# Patient Record
Sex: Male | Born: 1990 | Race: Black or African American | Hispanic: No | Marital: Single | Smoking: Former smoker
Health system: Southern US, Community
[De-identification: ages and names within clinical notes are randomized; demographics above are authoritative.]

## PROBLEM LIST (undated history)

## (undated) DIAGNOSIS — F419 Anxiety disorder, unspecified: Secondary | ICD-10-CM

## (undated) DIAGNOSIS — J302 Other seasonal allergic rhinitis: Secondary | ICD-10-CM

## (undated) DIAGNOSIS — J45909 Unspecified asthma, uncomplicated: Secondary | ICD-10-CM

## (undated) HISTORY — DX: Other seasonal allergic rhinitis: J30.2

## (undated) HISTORY — PX: NO PAST SURGERIES: SHX2092

---

## 2012-01-21 ENCOUNTER — Emergency Department (HOSPITAL_COMMUNITY)
Admission: EM | Admit: 2012-01-21 | Discharge: 2012-01-22 | Disposition: A | Discharge status: Other Acute Inpatient Hospital | Payer: BC Managed Care – PPO | Attending: Emergency Medicine | Admitting: Emergency Medicine

## 2012-01-21 ENCOUNTER — Encounter (HOSPITAL_COMMUNITY): Payer: Self-pay | Admitting: Emergency Medicine

## 2012-01-21 DIAGNOSIS — T394X2A Poisoning by antirheumatics, not elsewhere classified, intentional self-harm, initial encounter: Secondary | ICD-10-CM | POA: Insufficient documentation

## 2012-01-21 DIAGNOSIS — R45851 Suicidal ideations: Secondary | ICD-10-CM | POA: Insufficient documentation

## 2012-01-21 DIAGNOSIS — T391X1A Poisoning by 4-Aminophenol derivatives, accidental (unintentional), initial encounter: Secondary | ICD-10-CM | POA: Insufficient documentation

## 2012-01-21 DIAGNOSIS — T1491XA Suicide attempt, initial encounter: Secondary | ICD-10-CM

## 2012-01-21 DIAGNOSIS — F17200 Nicotine dependence, unspecified, uncomplicated: Secondary | ICD-10-CM | POA: Insufficient documentation

## 2012-01-21 LAB — COMPREHENSIVE METABOLIC PANEL
Albumin: 4.3 g/dL (ref 3.5–5.2)
BUN: 12 mg/dL (ref 6–23)
CO2: 21 mEq/L (ref 19–32)
Chloride: 103 mEq/L (ref 96–112)
Creatinine, Ser: 0.86 mg/dL (ref 0.50–1.35)
GFR calc Af Amer: >90 mL/min (ref >90)
GFR calc non Af Amer: >90 mL/min (ref >90)
Glucose, Bld: 136 mg/dL — ABNORMAL HIGH (ref 70–99)
Total Bilirubin: 0.3 mg/dL (ref 0.3–1.2)

## 2012-01-21 LAB — RAPID URINE DRUG SCREEN, HOSP PERFORMED
Amphetamines: NOT DETECTED
Benzodiazepines: NOT DETECTED
Opiates: POSITIVE — AB

## 2012-01-21 LAB — CBC WITH DIFFERENTIAL/PLATELET
Basophils Absolute: 0 10*3/uL (ref 0.0–0.1)
Eosinophils Relative: 2 % (ref 0–5)
HCT: 40.2 % (ref 39.0–52.0)
Lymphocytes Relative: 37 % (ref 12–46)
MCV: 81.5 fL (ref 78.0–100.0)
Monocytes Absolute: 0.6 10*3/uL (ref 0.1–1.0)
RDW: 13.4 % (ref 11.5–15.5)
WBC: 8.8 10*3/uL (ref 4.0–10.5)

## 2012-01-21 LAB — URINALYSIS, ROUTINE W REFLEX MICROSCOPIC
Glucose, UA: NEGATIVE mg/dL
Leukocytes, UA: NEGATIVE
Protein, ur: NEGATIVE mg/dL
Specific Gravity, Urine: 1.022 (ref 1.005–1.030)
pH: 5.5 (ref 5.0–8.0)

## 2012-01-21 LAB — ETHANOL: Alcohol, Ethyl (B): <11 mg/dL (ref 0–11)

## 2012-01-21 MED ORDER — LORAZEPAM 1 MG PO TABS: 1.0000 mg | ORAL_TABLET | Freq: Three times a day (TID) | ORAL | Status: DC | PRN

## 2012-01-21 MED ORDER — ALUM & MAG HYDROXIDE-SIMETH 200-200-20 MG/5ML PO SUSP: 30.0000 mL | ORAL | Status: DC | PRN

## 2012-01-21 MED ORDER — NICOTINE 21 MG/24HR TD PT24: 21.0000 mg | MEDICATED_PATCH | Freq: Every day | TRANSDERMAL | Status: DC

## 2012-01-21 MED ORDER — IBUPROFEN 600 MG PO TABS
600.0000 mg | ORAL_TABLET | Freq: Three times a day (TID) | ORAL | Status: DC | PRN
  Administered 2012-01-21: 600 mg via ORAL
  Filled 2012-01-21: qty 3

## 2012-01-21 MED ORDER — DIPHENHYDRAMINE HCL 50 MG/ML IJ SOLN
25.0000 mg | Freq: Once | INTRAMUSCULAR | Status: AC
  Administered 2012-01-21: 25 mg via INTRAVENOUS
  Filled 2012-01-21: qty 1

## 2012-01-21 MED ORDER — ONDANSETRON HCL 4 MG PO TABS: 4.0000 mg | ORAL_TABLET | Freq: Three times a day (TID) | ORAL | Status: DC | PRN

## 2012-01-21 NOTE — ED Notes (Signed)
Pt gave this Clinical research associate permission to update his mom.  Mother, Jani Gravel, on her way.

## 2012-01-21 NOTE — ED Notes (Signed)
OZH:[id number]<HQ> Expected date:01/21/12<BR> Expected time: 3:36 AM<BR> Means of arrival:Ambulance<BR> Comments:<BR> RM 17: OD hydrocodone
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
## 2012-01-21 NOTE — ED Notes (Signed)
Pt sleeping. 

## 2012-01-21 NOTE — ED Notes (Signed)
Pt belongs are at nurses station .  Id, Keys and clothing are in bag.

## 2012-01-21 NOTE — Progress Notes (Signed)
Patient accepted by Jorje Guild, PA to [facility] pending an available bed. Currently no beds available. Rosey Bath, RN

## 2012-01-21 NOTE — ED Provider Notes (Signed)
Medical screening examination/treatment/procedure(s) were performed by non-physician practitioner and as supervising physician I was immediately available for consultation/collaboration.   Sunnie Nielsen, MD 01/21/12 [phone]
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
## 2012-01-21 NOTE — ED Notes (Signed)
Pt belongings in locker number 39 one belonging bag.
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
## 2012-01-21 NOTE — ED Provider Notes (Signed)
Assumed care of patient at change of shift.  Sign out received from Marlon Pel, PA-C.  Pt with suicide attempt overnight, overdose on Tylenol #3.  Plan is for 4 hour tylenol level (already ordered).  If medically cleared, pt to be seen by ACT for placement.  Pt is currently sleeping.  No concerns or needs at this time.  Pt is sleeping but wakes up easily, NAD, RRR, CTAB, abd soft, NT.   9:24 AM Discussed with Tammy Sours, Child psychotherapist, who is covering ACT this morning.  4 hour tylenol was negative.    Tammy Sours has seen patient and is sending in paperwork for placement for suicide attempt.     Results for orders placed during the hospital encounter of 01/21/12  URINALYSIS, ROUTINE W REFLEX MICROSCOPIC      Component Value Range   Color, Urine YELLOW  YELLOW   APPearance CLEAR  CLEAR   Specific Gravity, Urine 1.022  1.005 - 1.030   pH 5.5  5.0 - 8.0   Glucose, UA NEGATIVE  NEGATIVE mg/dL   Hgb urine dipstick NEGATIVE  NEGATIVE   Bilirubin Urine NEGATIVE  NEGATIVE   Ketones, ur NEGATIVE  NEGATIVE mg/dL   Protein, ur NEGATIVE  NEGATIVE mg/dL   Urobilinogen, UA 0.2  0.0 - 1.0 mg/dL   Nitrite NEGATIVE  NEGATIVE   Leukocytes, UA NEGATIVE  NEGATIVE  URINE RAPID DRUG SCREEN (HOSP PERFORMED)      Component Value Range   Opiates POSITIVE (*) NONE DETECTED   Cocaine NONE DETECTED  NONE DETECTED   Benzodiazepines NONE DETECTED  NONE DETECTED   Amphetamines NONE DETECTED  NONE DETECTED   Tetrahydrocannabinol NONE DETECTED  NONE DETECTED   Barbiturates NONE DETECTED  NONE DETECTED  SALICYLATE LEVEL      Component Value Range   Salicylate Lvl <2.0 (*) 2.8 - 20.0 mg/dL  CBC WITH DIFFERENTIAL      Component Value Range   WBC 8.8  4.0 - 10.5 K/uL   RBC 4.93  4.22 - 5.81 MIL/uL   Hemoglobin 14.6  13.0 - 17.0 g/dL   HCT 09.8  11.9 - 14.7 %   MCV 81.5  78.0 - 100.0 fL   MCH 29.6  26.0 - 34.0 pg   MCHC 36.3 (*) 30.0 - 36.0 g/dL   RDW 82.9  56.2 - 13.0 %   Platelets 276  150 - 400 K/uL   Neutrophils  Relative 54  43 - 77 %   Neutro Abs 4.8  1.7 - 7.7 K/uL   Lymphocytes Relative 37  12 - 46 %   Lymphs Abs 3.3  0.7 - 4.0 K/uL   Monocytes Relative 6  3 - 12 %   Monocytes Absolute 0.6  0.1 - 1.0 K/uL   Eosinophils Relative 2  0 - 5 %   Eosinophils Absolute 0.2  0.0 - 0.7 K/uL   Basophils Relative 0  0 - 1 %   Basophils Absolute 0.0  0.0 - 0.1 K/uL  ACETAMINOPHEN LEVEL      Component Value Range   Acetaminophen (Tylenol), Serum <15.0  10 - 30 ug/mL  COMPREHENSIVE METABOLIC PANEL      Component Value Range   Sodium 138  135 - 145 mEq/L   Potassium 3.3 (*) 3.5 - 5.1 mEq/L   Chloride 103  96 - 112 mEq/L   CO2 21  19 - 32 mEq/L   Glucose, Bld 136 (*) 70 - 99 mg/dL   BUN 12  6 - 23 mg/dL  Creatinine, Ser 0.86  0.50 - 1.35 mg/dL   Calcium 9.5  8.4 - 16.1 mg/dL   Total Protein 7.5  6.0 - 8.3 g/dL   Albumin 4.3  3.5 - 5.2 g/dL   AST 27  0 - 37 U/L   ALT 36  0 - 53 U/L   Alkaline Phosphatase 77  39 - 117 U/L   Total Bilirubin 0.3  0.3 - 1.2 mg/dL   GFR calc non Af Amer >90  >90 mL/min   GFR calc Af Amer >90  >90 mL/min  ETHANOL      Component Value Range   Alcohol, Ethyl (B) <11  0 - 11 mg/dL   No results found.    [employer], [place] 01/21/12 1630

## 2012-01-22 ENCOUNTER — Inpatient Hospital Stay (HOSPITAL_COMMUNITY)
Admission: AD | Admit: 2012-01-22 | Discharge: 2012-01-24 | DRG: 425 | Disposition: A | Discharge status: Home / Self Care | Payer: BC Managed Care – PPO | Source: Ambulatory Visit | Attending: Psychiatry | Admitting: Psychiatry

## 2012-01-22 ENCOUNTER — Encounter (HOSPITAL_COMMUNITY): Payer: Self-pay | Admitting: *Deleted

## 2012-01-22 DIAGNOSIS — F605 Obsessive-compulsive personality disorder: Secondary | ICD-10-CM

## 2012-01-22 DIAGNOSIS — F411 Generalized anxiety disorder: Principal | ICD-10-CM | POA: Diagnosis present

## 2012-01-22 DIAGNOSIS — F419 Anxiety disorder, unspecified: Secondary | ICD-10-CM | POA: Diagnosis present

## 2012-01-22 DIAGNOSIS — F429 Obsessive-compulsive disorder, unspecified: Secondary | ICD-10-CM | POA: Diagnosis present

## 2012-01-22 MED ORDER — IBUPROFEN 800 MG PO TABS
800.0000 mg | ORAL_TABLET | Freq: Three times a day (TID) | ORAL | Status: DC
  Filled 2012-01-22: qty 1

## 2012-01-22 MED ORDER — ALUM & MAG HYDROXIDE-SIMETH 200-200-20 MG/5ML PO SUSP: 30.0000 mL | ORAL | Status: DC | PRN

## 2012-01-22 MED ORDER — ACETAMINOPHEN 325 MG PO TABS: 650.0000 mg | ORAL_TABLET | Freq: Four times a day (QID) | ORAL | Status: DC | PRN

## 2012-01-22 MED ORDER — MAGNESIUM HYDROXIDE 400 MG/5ML PO SUSP: 30.0000 mL | Freq: Every day | ORAL | Status: DC | PRN

## 2012-01-22 MED ORDER — IBUPROFEN 600 MG PO TABS
600.0000 mg | ORAL_TABLET | Freq: Four times a day (QID) | ORAL | Status: DC | PRN
  Administered 2012-01-23: 600 mg via ORAL
  Filled 2012-01-22: qty 1

## 2012-01-22 MED ORDER — POTASSIUM CHLORIDE 20 MEQ PO PACK
20.0000 meq | PACK | Freq: Once | ORAL | Status: DC
  Filled 2012-01-22: qty 1

## 2012-01-22 MED ORDER — HYDROXYZINE HCL 50 MG PO TABS
50.0000 mg | ORAL_TABLET | Freq: Every evening | ORAL | Status: DC | PRN
  Administered 2012-01-22 – 2012-01-23 (×2): 50 mg via ORAL
  Filled 2012-01-22: qty 4
  Filled 2012-01-22 (×3): qty 1
  Filled 2012-01-22: qty 4
  Filled 2012-01-22 (×3): qty 1

## 2012-01-22 MED ORDER — NICOTINE 21 MG/24HR TD PT24
21.0000 mg | MEDICATED_PATCH | Freq: Every day | TRANSDERMAL | Status: DC
  Filled 2012-01-22: qty 1

## 2012-01-22 MED ORDER — POTASSIUM CHLORIDE CRYS ER 20 MEQ PO TBCR
20.0000 meq | EXTENDED_RELEASE_TABLET | Freq: Once | ORAL | Status: AC
  Administered 2012-01-22: 20 meq via ORAL
  Filled 2012-01-22 (×2): qty 1

## 2012-01-22 MED ORDER — HYDROXYZINE HCL 50 MG PO TABS
50.0000 mg | ORAL_TABLET | Freq: Every evening | ORAL | Status: DC | PRN
  Filled 2012-01-22: qty 1

## 2012-01-22 NOTE — ED Notes (Addendum)
Report called to [employer] on adult unit at [facility].

## 2012-01-22 NOTE — ED Notes (Signed)
[employer] delivered envelope to pt. Pt resting in the bed

## 2012-01-22 NOTE — ED Provider Notes (Signed)
Assuming care of the patient this morning. Patient in the ED for tylenol OD. Accepted by [facility], awaiting bed. Patient had no complains, no concerns from the nursing side. Will continue to monitor.  Derwood Kaplan, MD 01/22/12 1315
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
## 2012-01-22 NOTE — ED Notes (Addendum)
Pt to be transported to [facility] as soon as they open intake again. Admissions temporarily on hold due to a CIRT, per previous shift staff.
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
## 2012-01-23 DIAGNOSIS — F605 Obsessive-compulsive personality disorder: Secondary | ICD-10-CM | POA: Diagnosis present

## 2012-01-23 DIAGNOSIS — F4321 Adjustment disorder with depressed mood: Secondary | ICD-10-CM

## 2012-01-23 DIAGNOSIS — F419 Anxiety disorder, unspecified: Secondary | ICD-10-CM | POA: Diagnosis present

## 2012-01-23 LAB — COMPREHENSIVE METABOLIC PANEL
Albumin: 4.2 g/dL (ref 3.5–5.2)
Alkaline Phosphatase: 75 U/L (ref 39–117)
BUN: 14 mg/dL (ref 6–23)
Calcium: 9.6 mg/dL (ref 8.4–10.5)
GFR calc Af Amer: >90 mL/min (ref >90)
Glucose, Bld: 101 mg/dL — ABNORMAL HIGH (ref 70–99)
Potassium: 3.7 mEq/L (ref 3.5–5.1)
Total Protein: 7.6 g/dL (ref 6.0–8.3)
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
## 2012-01-23 NOTE — Progress Notes (Signed)
[facility] Group Notes:  (Counselor/Nursing/MHT/Case Management/Adjunct)  01/23/2012 7:22 PM  Type of Therapy:  Psychoeducational Skills  Participation Level:  Active  Participation Quality:  Appropriate  Affect:  Appropriate  Cognitive:  Appropriate  Insight:  Good  Engagement in Group:  Good  Engagement in Therapy:  Good  Modes of Intervention:  Education  Summary of Progress/Problems:Patient was instructed to identify two areas in their life in which they would like to make a change toward recovery. Patient was encouraged to define "recovery" and what it means to their present situation. Patient was encouraged to set short realistic goals as well as long term goals.   Ardelle Park O 01/23/2012, 7:22 PM

## 2012-01-23 NOTE — Progress Notes (Signed)
Psychoeducational Group Note  Date:  01/23/2012 Time:  2000  Group Topic/Focus:  Wrap-Up Group:   The focus of this group is to help patients review their daily goal of treatment and discuss progress on daily workbooks.  Participation Level:  Active  Participation Quality:  Patient did attend AA meeting tonight.  Affect:    Cognitive:    Insight:    Engagement in Group:    Additional Comments: Patient did attend AA meeting tonight.  Cameo Schmiesing N 01/23/2012, 10:13 PM 

## 2012-01-23 NOTE — Tx Team (Signed)
Patient seen during d/c planning group and treatment team.  He reports attempting suicide due to fear roommates were going to kill him and problems with girlfriend.  He advised roommates are drug users and dealers and this has caused problems with their relationship.Patient currently denies SI/HI.  He reports having home, transportation and access to medications.  Patient signed consent for contact with CM at [facility].  Brayton Caves advised of plans to meet with patient on 01/24/12.  Horace Porteous Mckala Pantaleon, LCSW

## 2012-01-23 NOTE — H&P (Signed)
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 I have seen and examined this patient and agree with the major elements of this evaluation.

## 2012-01-23 NOTE — Tx Team (Signed)
Interdisciplinary Treatment Plan Update (Adult)  Date:  01/23/2012  Time Reviewed:  9:46 AM   Progress in Treatment: Attending groups:   Yes   Participating in groups:  Yes Taking medication as prescribed:  Yes Tolerating medication:  Yes Family/Significant othe contact made: Counselor to follow up patient on collateral contact Patient understands diagnosis:  Yes Discussing patient identified problems/goals with staff: Yes Medical problems stabilized or resolved: Yes Denies suicidal/homicidal ideation:Yes Issues/concerns per patient self-inventory:  Other:  New problem(s) identified:  Reason for Continuation of Hospitalization: Anxiety Depression Medication stabilization  Interventions implemented related to continuation of hospitalization:  Medication Management; safety checks q 15 mins  Additional comments:  Estimated length of stay:  Discharge Plan:  New goal(s):  Review of initial/current patient goals per problem list:    1.  Goal(s): Eliminate SI/other thoughts of self harm ]  Met:  Yes  Target date:  As evidenced by: Patient no longer endorses SI/other thought self harm    2.  Goal (s):  Reduce depression/anxiety   Met:  No  Target date: d/c  As evidenced by: Patient currently rating symptoms at four or below    3.  Goal(s): refer for outpatient services  Met:  No  Target date:  As evidenced by: Follow up appointment will be scheduled    4.  Goal(s): .stabilize on meds   Met:  No  Target date: d/c  As evidenced by: Patient reports being stabilized on medications - less symptomatic    Attendees: Patient:  Roy Ferguson 01/23/2012 9:56 AM  Nursing:  Lennette Bihari, RN   01/23/2012 9:57 AM  Physician:  Orson Aloe, MD 01/23/2012 9:46 AM   Nursing:   Quintella Reichert, RN 01/23/2012 9:46 AM   CaseManager:  Juline Patch, LCSW 01/23/2012 9:46 AM   Counselor:  Angus Palms, LCSW 01/23/2012 9:46 AM   Other:  Reyes Ivan,  LCSWA     01/23/2012 9:59 AM
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
## 2012-01-23 NOTE — Progress Notes (Signed)
Pt observed in the dayroom watching TV.  Minimal interaction with peers.  Pt attended AA group on the the 500 hall this evening.  Pt reports his day has been good.  He has attended groups and met with the MD.  Pt voiced no needs/concerns at this time with this Clinical research associate.  Pt denies SI/HI/AV.  Encouraged pt to make his needs known to staff.  Pt voiced understanding.  Pt given support/encouragement.  Safety maintained with q15 minute checks.

## 2012-01-23 NOTE — Progress Notes (Addendum)
D:  Patient self inventory sheet, patient sleeps fair, has good appetite, normal energy level, good attention span.  Denied withdrawals.   Denied SI.   Has had headache in past 24 hours.  Worst pain #1.  After discharge, desires to handle stress better.  No problems taking meds after discharge. A:  Meds per MD order.  Support and encouragement given throughout day.  Safety checks per MD order. R:  Following treatment plan.   Denies SI and HI.   Denied A/V hallucinations.  Denied pain.  Patient remains safe and receptive on unit.  Patient has attended groups today.  Has been cooperative and pleasant.
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
## 2012-01-24 MED ORDER — HYDROXYZINE HCL 50 MG PO TABS: 50.0000 mg | ORAL_TABLET | Freq: Every evening | ORAL | Status: AC | PRN
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
## 2012-01-24 NOTE — Tx Team (Signed)
Interdisciplinary Treatment Plan Update (Adult)  Date:  01/24/2012  Time Reviewed:  10:15 AM   Progress in Treatment: Attending groups:   Yes   Participating in groups:  Yes Taking medication as prescribed:  Yes Tolerating medication:  Yes Family/Significant othe contact made: Counselor to make contact with family prior to his discharge Patient understands diagnosis:  Yes Discussing patient identified problems/goals with staff: Yes Medical problems stabilized or resolved: Yes Denies suicidal/homicidal ideation:Yes Issues/concerns per patient self-inventory:  Other:  New problem(s) identified:  Reason for Continuation of Hospitalization:  Interventions implemented related to continuation of hospitalization:  Additional comments:  Estimated length of stay:  Discharge today  Discharge Plan: Home with outpatient follow up  New goal(s):  Review of initial/current patient goals per problem list:    1.  Goal(s):  Eliminate SI/other thoughts of self harm   Met:  Yes  Target date:  d/c  As evidenced by:  Patient no longer endorses SI/other thought self harm    2.  Goal (s): Reduce depression/anxiety   Met:  Yes  Target date: d/c  As evidenced by: Patient currently rating symptoms at four or below    3.  Goal(s): .stabilize on meds   Met:  Yes  Target date: d/c  As evidenced by: Patient reports being stabilized on medications - less symptomatic  4.  Goal(s): Refer for outpatient follow up   Met:  Yes  Target date: d/c  As evidenced by:  Follow up scheduled with [facility]  Attendees: Patient:  Roy Ferguson 01/24/2012 10:15 AM  Nursing:  Nestor Ramp, RN 01/24/2012 10:16 AM  Physician:  Orson Aloe, MD 01/24/2012 10:15 AM   Nursing:   Marlaine Hind 01/24/2012 10:15 AM   CaseManager:  Juline Patch, LCSW 01/24/2012 10:15 AM   Counselor:  Angus Palms, LCSW 01/24/2012 10:15 AM   Other:  Rana Snare Bournes      01/24/2012 10:16 AM
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
## 2012-01-24 NOTE — Progress Notes (Signed)
Psychoeducational Group Note  Date:  01/24/2012 Time: 1100  Group Topic/Focus:  Personal Choices and Values:   The focus of this group is to help patients assess and explore the importance of values in their lives, how their values affect their decisions, how they express their values and what opposes their expression.  Participation Level:  Active  Participation Quality:  Appropriate, Attentive, Sharing and Supportive  Affect:  Appropriate  Cognitive:  Appropriate  Insight:  Good  Engagement in Group:  Good  Additional Comments:  Pt participated in group of personal values and choices. Pt was asked what were important values that were positive. Pt also asked what were negative values and choices that have been made in the past and how it affected future. Pt complete worksheet that was given out on identifying values, and values-oriented life worksheet.  Pt shared and was appropriate during group.   Karleen Hampshire Brittini 01/24/2012, 1:38 PM

## 2012-01-24 NOTE — Progress Notes (Signed)
Group Note  Date:  01/24/2012 Time:  1:15  Group Topic/Focus:  Emotional Education:   The focus of this group is to discuss what feelings/emotions are, and how they are experienced.  Participation Level:  Active  Participation Quality:  Appropriate  Affect:  Appropriate  Cognitive:  Appropriate  Insight:  Good  Engagement in Group:  Good  Additional Comments:  Roy Ferguson was engaged throughout group and appropriately shared his thoughts regularly. He left around 2/3rds into group.  Filip Luten S 01/24/2012, 3:27 PM
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
## 2012-01-24 NOTE — Progress Notes (Signed)
Patient denies SI/HI, denies A/V hallucinations. Patient verbalizes understanding of discharge instructions, follow up care and prescriptions. Patient given all belongings from [facility] locker. Patient escorted out by staff, transported by public transportation.  

## 2012-01-24 NOTE — Progress Notes (Signed)
[facility] Family/Significant Other Suicide Prevention Education  Suicide Prevention Education:  Education Completed;Troy Towry, father, [phone]) has been identified by the patient as the family member/significant other with whom the patient will be residing, and identified as the person(s) who will aid the patient in the event of a mental health crisis (suicidal ideations/suicide attempt).  With written consent from the patient, the family member/significant other has been provided the following suicide prevention education, prior to the and/or following the discharge of the patient.  The suicide prevention education provided includes the following:  Suicide risk factors  Suicide prevention and interventions  National Suicide Hotline telephone number  [facility] assessment telephone number  [facility] Emergency Assistance 911  [facility] and/or Residential Mobile Crisis Unit telephone number  Request made of family/significant other to:  Remove weapons (e.g., guns, rifles, knives), all items previously/currently identified as safety concern.    Remove drugs/medications (over-the-counter, prescriptions, illicit drugs), all items previously/currently identified as a safety concern.  Kendell Bane reported that he has never known Mykah to be violent or suicidal before, although parents have wondered about his anger and wanted to get him help as a teenager, which Danile refused. He stated that he is working on Sales executive apartment changed, as he does not want him returning to live with the roommates who have made a verbal threat toward him.   Kendell Bane verbalized understanding of suicide prevention information and does not think that Ural has access to firearms. He is in support of discharge plan, including appointments with Dr. Piedad Climes and Jerolyn Shin at [facility].    Billie Lade 01/24/2012, 2:45 PM

## 2012-01-26 NOTE — Progress Notes (Signed)
Patient Discharge Instructions:  After Visit Summary (AVS):   Faxed to:  01/26/2012 Psychiatric Admission Assessment Note:   Faxed to:  01/26/2012 Suicide Risk Assessment - Discharge Assessment:   Faxed to:  01/26/2012 Faxed/Sent to the Next Level Care provider:  01/26/2012  Faxed to [facility] - Casimer Bilis, Dr. Piedad Climes @ [phone]  Heloise Purpura, Eduard Clos, 01/26/2012, 1:57 PM

## 2012-03-10 ENCOUNTER — Emergency Department (HOSPITAL_COMMUNITY)
Admission: EM | Admit: 2012-03-10 | Discharge: 2012-03-10 | Disposition: A | Discharge status: Home / Self Care | Payer: BC Managed Care – PPO | Attending: Emergency Medicine | Admitting: Emergency Medicine

## 2012-03-10 ENCOUNTER — Encounter (HOSPITAL_COMMUNITY): Payer: Self-pay | Admitting: *Deleted

## 2012-03-10 DIAGNOSIS — R45851 Suicidal ideations: Secondary | ICD-10-CM | POA: Insufficient documentation

## 2012-03-10 DIAGNOSIS — F329 Major depressive disorder, single episode, unspecified: Secondary | ICD-10-CM | POA: Insufficient documentation

## 2012-03-10 DIAGNOSIS — F17200 Nicotine dependence, unspecified, uncomplicated: Secondary | ICD-10-CM | POA: Insufficient documentation

## 2012-03-10 DIAGNOSIS — F10929 Alcohol use, unspecified with intoxication, unspecified: Secondary | ICD-10-CM

## 2012-03-10 DIAGNOSIS — F1010 Alcohol abuse, uncomplicated: Secondary | ICD-10-CM | POA: Insufficient documentation

## 2012-03-10 DIAGNOSIS — F3289 Other specified depressive episodes: Secondary | ICD-10-CM | POA: Insufficient documentation

## 2012-03-10 LAB — RAPID URINE DRUG SCREEN, HOSP PERFORMED
Amphetamines: NOT DETECTED
Benzodiazepines: NOT DETECTED
Cocaine: NOT DETECTED
Opiates: NOT DETECTED
Tetrahydrocannabinol: NOT DETECTED

## 2012-03-10 LAB — CBC
MCH: 29.7 pg (ref 26.0–34.0)
MCHC: 36.5 g/dL — ABNORMAL HIGH (ref 30.0–36.0)
MCV: 81.4 fL (ref 78.0–100.0)
Platelets: 294 10*3/uL (ref 150–400)
RDW: 13.2 % (ref 11.5–15.5)

## 2012-03-10 LAB — COMPREHENSIVE METABOLIC PANEL
ALT: 25 U/L (ref 0–53)
AST: 25 U/L (ref 0–37)
Albumin: 4.3 g/dL (ref 3.5–5.2)
CO2: 23 mEq/L (ref 19–32)
Calcium: 9.2 mg/dL (ref 8.4–10.5)
Creatinine, Ser: 0.93 mg/dL (ref 0.50–1.35)
GFR calc non Af Amer: >90 mL/min (ref >90)
Sodium: 138 mEq/L (ref 135–145)
Total Protein: 7.4 g/dL (ref 6.0–8.3)

## 2012-03-10 LAB — ACETAMINOPHEN LEVEL: Acetaminophen (Tylenol), Serum: <15 ug/mL (ref 10–30)

## 2012-03-10 LAB — SALICYLATE LEVEL: Salicylate Lvl: <2 mg/dL — ABNORMAL LOW (ref 2.8–20.0)

## 2012-03-10 MED ORDER — NICOTINE 21 MG/24HR TD PT24: 21.0000 mg | MEDICATED_PATCH | Freq: Every day | TRANSDERMAL | Status: DC

## 2012-03-10 MED ORDER — ZOLPIDEM TARTRATE 5 MG PO TABS: 5.0000 mg | ORAL_TABLET | Freq: Every evening | ORAL | Status: DC | PRN

## 2012-03-10 MED ORDER — IBUPROFEN 600 MG PO TABS: 600.0000 mg | ORAL_TABLET | Freq: Three times a day (TID) | ORAL | Status: DC | PRN

## 2012-03-10 MED ORDER — ONDANSETRON HCL 4 MG PO TABS: 4.0000 mg | ORAL_TABLET | Freq: Three times a day (TID) | ORAL | Status: DC | PRN

## 2012-03-10 MED ORDER — ACETAMINOPHEN 325 MG PO TABS: 650.0000 mg | ORAL_TABLET | ORAL | Status: DC | PRN

## 2012-03-10 MED ORDER — ALUM & MAG HYDROXIDE-SIMETH 200-200-20 MG/5ML PO SUSP
30.0000 mL | ORAL | Status: DC | PRN
Start: 2012-03-10 — End: 2012-03-10

## 2012-03-10 NOTE — ED Notes (Signed)
Pt was up to bathroom 

## 2012-03-10 NOTE — ED Notes (Signed)
Pt signed No Harm Contract and verbalized his understanding of not to harm himself upon discharge. CSW provided pt with information for Mobile Crisis in the event that he has suicidal ideations in the future and encouraged pt to contact Mobile Crisis or return to [facility] for evaluation. Pt verbalized to CSW that he is not currently suicidal and that he anticipates following up with his counselor at [facility]. No other concerns verbalized by pt at this time.  Janann Colonel., MSW, [facility] Clinical Social Worker [phone]

## 2012-03-10 NOTE — BH Assessment (Signed)
[facility] Assessment Progress Note      Attempted to assess patient.  He was unable to stay awake to be questioned and when clinician was able to wake him enough to ask any questions, his answers were illogical-His response to tell me what happened tonight, how did you end up in the emergency room, was, "I just woke up and I was here."  He then fell back asleep. Attempted to wake him several more times, but was unable.  Told patient we would continue to follow him.

## 2012-03-10 NOTE — ED Notes (Signed)
Pt is eating breakfast  

## 2012-03-10 NOTE — ED Notes (Signed)
Specialist on Call referral documentation provided to RN to initiate referral process. Disposition pending.

## 2012-03-10 NOTE — ED Notes (Signed)
EDP is in seeing the pt

## 2012-03-10 NOTE — ED Notes (Signed)
Up in his room, just went to bathroom and got paper and pencil from nursing station.

## 2012-03-10 NOTE — ED Provider Notes (Signed)
History     CSN: [id number]  Arrival date & time 03/10/12  0241   First MD Initiated Contact with Patient 03/10/12 0315      Chief Complaint  Patient presents with  . Medical Clearance    (Consider location/radiation/quality/duration/timing/severity/associated sxs/prior treatment) HPI\ Currently voluntary, cops interfered on his plans to jump off a bridge  Pt to the ER by [employer] after making a call saying that he was going to jump off of a bridge. He says he has bipolar disorder and takes a medicine that starts with an "L'. He then decided to get very drunk off of alcohol and "off himself". He went looking for a train but no train would come so he decided to jump off a roof. While in the stairwell, he called the police who went on a hunt to find him. After they found him they brought him to the ER. Pt acting very odd, is intoxicated but laughing inappropriately.    History reviewed. No pertinent past medical history.  History reviewed. No pertinent past surgical history.  History reviewed. No pertinent family history.  History  Substance Use Topics  . Smoking status: Current Some Day Smoker  . Smokeless tobacco: Never Used  . Alcohol Use: Yes     Socially on weekends      Review of Systems Unable due to patients acute  psychiatric condition Allergies  Apple; Codeine; and Sulfa drugs cross reactors  Home Medications  No current outpatient prescriptions on file.  There were no vitals taken for this visit.  Physical Exam  Nursing note and vitals reviewed. Constitutional: He appears well-developed and well-nourished. No distress.  HENT:  Head: Normocephalic and atraumatic.  Eyes: Pupils are equal, round, and reactive to light.  Neck: Normal range of motion. Neck supple.  Cardiovascular: Normal rate and regular rhythm.   Pulmonary/Chest: Effort normal.  Abdominal: Soft.  Neurological: He is alert.  Skin: Skin is warm and dry.  Psychiatric: His affect is  inappropriate. He exhibits a depressed mood. He expresses suicidal ideation. He expresses no homicidal ideation. He expresses suicidal plans. He expresses no homicidal plans.    ED Course  Procedures (including critical care time)   Labs Reviewed  URINE RAPID DRUG SCREEN (HOSP PERFORMED)  CBC  COMPREHENSIVE METABOLIC PANEL  ETHANOL  SALICYLATE LEVEL  ACETAMINOPHEN LEVEL   No results found.   1. Suicidal ideation   2. Depression   3. Alcohol intoxication       MDM  ACT consulted, holding orders placed.  Pt needs to be IVC'd if he wants to leave.          Dorthula Matas, PA 03/10/12 [phone]

## 2012-03-10 NOTE — ED Notes (Signed)
Went over pt's discharge papers with him, he indicated full understanding. Pt wishes to go out to the lobby and use his cell phone to call for a ride. Security walked pt to discharge window.

## 2012-03-10 NOTE — ED Notes (Signed)
telepsych completed 

## 2012-03-10 NOTE — ED Notes (Signed)
Pt brought in by [employer]  Pt attempted to jump off building tonight and officer saw him,  Then he wanted to walk out in front of traffic,  [employer]  Has gone downtown to take out IVC papers

## 2012-03-10 NOTE — ED Notes (Signed)
Telepsych info called and faxed in to [facility]

## 2012-03-10 NOTE — ED Provider Notes (Signed)
Medical screening examination/treatment/procedure(s) were performed by non-physician practitioner and as supervising physician I was immediately available for consultation/collaboration.  Natalie Leclaire, MD 03/10/12 0451 

## 2012-03-10 NOTE — ED Notes (Signed)
Pharmacy tech in talking with pt.

## 2013-04-29 ENCOUNTER — Ambulatory Visit
Admission: RE | Admit: 2013-04-29 | Discharge: 2013-04-29 | Disposition: A | Discharge status: Home / Self Care | Payer: BC Managed Care – PPO | Source: Ambulatory Visit | Attending: Nurse Practitioner | Admitting: Nurse Practitioner

## 2013-04-29 ENCOUNTER — Other Ambulatory Visit: Payer: Self-pay | Admitting: Nurse Practitioner

## 2013-04-29 DIAGNOSIS — M545 Low back pain: Secondary | ICD-10-CM

## 2016-09-12 ENCOUNTER — Institutional Professional Consult (permissible substitution): Payer: Self-pay | Admitting: Internal Medicine

## 2016-09-28 ENCOUNTER — Ambulatory Visit (INDEPENDENT_AMBULATORY_CARE_PROVIDER_SITE_OTHER)
Admission: RE | Admit: 2016-09-28 | Discharge: 2016-09-28 | Disposition: A | Discharge status: Home / Self Care | Payer: BLUE CROSS/BLUE SHIELD | Source: Ambulatory Visit | Attending: Internal Medicine | Admitting: Internal Medicine

## 2016-09-28 ENCOUNTER — Encounter: Payer: Self-pay | Admitting: Internal Medicine

## 2016-09-28 ENCOUNTER — Ambulatory Visit (INDEPENDENT_AMBULATORY_CARE_PROVIDER_SITE_OTHER): Payer: BLUE CROSS/BLUE SHIELD | Admitting: Internal Medicine

## 2016-09-28 ENCOUNTER — Other Ambulatory Visit (INDEPENDENT_AMBULATORY_CARE_PROVIDER_SITE_OTHER): Payer: BLUE CROSS/BLUE SHIELD

## 2016-09-28 VITALS — BP 120/80 | HR 74 | Ht 70.0 in | Wt 204.0 lb

## 2016-09-28 DIAGNOSIS — J45991 Cough variant asthma: Secondary | ICD-10-CM

## 2016-09-28 LAB — CBC WITH DIFFERENTIAL/PLATELET
BASOS ABS: 0 10*3/uL (ref 0.0–0.1)
BASOS PCT: 0.4 % (ref 0.0–3.0)
EOS ABS: 0.4 10*3/uL (ref 0.0–0.7)
Eosinophils Relative: 3.9 % (ref 0.0–5.0)
HEMATOCRIT: 44.4 % (ref 39.0–52.0)
HEMOGLOBIN: 15.5 g/dL (ref 13.0–17.0)
LYMPHS PCT: 40.1 % (ref 12.0–46.0)
Lymphs Abs: 4.2 10*3/uL — ABNORMAL HIGH (ref 0.7–4.0)
MCHC: 34.9 g/dL (ref 30.0–36.0)
MCV: 84.8 fl (ref 78.0–100.0)
MONO ABS: 0.6 10*3/uL (ref 0.1–1.0)
Monocytes Relative: 5.6 % (ref 3.0–12.0)
NEUTROS ABS: 5.3 10*3/uL (ref 1.4–7.7)
Neutrophils Relative %: 50 % (ref 43.0–77.0)
PLATELETS: 241 10*3/uL (ref 150.0–400.0)
RBC: 5.24 Mil/uL (ref 4.22–5.81)
RDW: 13.4 % (ref 11.5–15.5)
WBC: 10.6 10*3/uL — AB (ref 4.0–10.5)

## 2016-09-28 LAB — NITRIC OXIDE: Nitric Oxide: 18

## 2016-09-28 MED ORDER — BUDESONIDE-FORMOTEROL FUMARATE 80-4.5 MCG/ACT IN AERO: 2.0000 | INHALATION_SPRAY | Freq: Two times a day (BID) | RESPIRATORY_TRACT | 11 refills | Status: DC

## 2016-09-28 MED ORDER — BUDESONIDE-FORMOTEROL FUMARATE 80-4.5 MCG/ACT IN AERO: 2.0000 | INHALATION_SPRAY | Freq: Two times a day (BID) | RESPIRATORY_TRACT | 0 refills | Status: DC

## 2016-09-28 NOTE — Patient Instructions (Signed)
Start symbicort 80 Take 2 puffs first thing in am and then another 2 puffs about 12 hours later - if better fill the prescription, if not, return here    Work on inhaler technique:  relax and gently blow all the way out then take a nice smooth deep breath back in, triggering the inhaler at same time you start breathing in.  Hold for up to 5 seconds if you can. Blow out thru nose. Rinse and gargle with water when done  Please remember to go to the lab and x-ray department downstairs in the basement  for your tests - we will call you with the results when they are available.     Please schedule a follow up office visit in 6 weeks, call sooner if needed

## 2016-09-28 NOTE — Progress Notes (Signed)
Subjective:     Patient ID: Roy Ferguson, male   DOB: 20-Jun-1990,    MRN: [id number]  HPI  25 yobm quit smoking cig  in 2016 but still doing MJ  with asthma as child requiring er and neb [place] and since then variable sense of sob / chest tightness no better with singulair but better p albuterol so [facility] pulmonary clinic [facility]2016 [facility]   09/28/2016 1st [facility] office visit/ Roy Ferguson   Chief Complaint  Patient presents with  . Pulmonary Consult    Referred by Roy CourseBrian Belfi, PA.  Pt states dxed with Asthma as a child. He states he has always felt SOB "all the time". He also c/o cough with yellow sputum.    sleeping ok but every time exerts gets sob and many times gets sob at rests lasts up to 30-40 min and resolves spontanously Still smoking marijuana   Cough is worse in am's / all symptoms better with saba but only using once a day  No real seasonal pattern to symptoms  No obvious day to day or daytime variability or assoc   mucus plugs or hemoptysis or cp or chest tightness, subjective wheeze or overt hb symptoms. No unusual exp hx or h/o childhood pna/ asthma or knowledge of premature birth.  Sleeping ok without nocturnal exacerbation  of respiratory  c/o's or need for noct saba. Also denies any obvious fluctuation of symptoms with weather or environmental changes or other aggravating or alleviating factors except as outlined above   Current Medications, Allergies, Complete Past Medical History, Past Surgical History, Family History, and Social History were reviewed in Roy [facility] electronic medical record.  ROS  The following are not active complaints unless bolded sore throat, dysphagia, dental problems, itching, sneezing,  nasal congestion or excess/ purulent secretions, ear ache,   fever, chills, sweats, unintended wt loss, classically pleuritic or exertional cp,  orthopnea pnd or leg swelling, presyncope, palpitations, abdominal pain,  anorexia, nausea, vomiting, diarrhea  or change in bowel or bladder habits, change in stools or urine, dysuria,hematuria,  rash, arthralgias, visual complaints, headache, numbness, weakness or ataxia or problems with walking or coordination,  change in mood/affect or memory.              Review of Systems     Objective:   Physical Exam    amb bm nad but very evasive with questions related to  specific symptoms   Wt Readings from Last 3 Encounters:  09/28/16 204 lb (92.5 kg)    Vital signs reviewed - Note on arrival 02 sats  95% on RA      HEENT: nl dentition, turbinates bilaterally, and oropharynx. Nl external ear canals without cough reflex   NECK :  without JVD/Nodes/TM/ nl carotid upstrokes bilaterally   LUNGS: no acc muscle use,  Nl contour chest which is clear to A and P bilaterally without cough on insp or exp maneuvers   CV:  RRR  no s3 or murmur or increase in P2, and no edema   ABD:  soft and nontender with nl inspiratory excursion in the supine position. No bruits or organomegaly appreciated, bowel sounds nl  MS:  Nl gait/ ext warm without deformities, calf tenderness, cyanosis or clubbing No obvious joint restrictions   SKIN: warm and dry without lesions    NEURO:  alert, approp, nl sensorium with  no motor or cerebellar deficits apparent.      CXR PA  and Lateral:   09/28/2016 :    I personally reviewed images and agree with radiology impression as follows:    No active cardiopulmonary disease.   Labs ordered 09/28/2016    allerygy profile      Assessment:

## 2016-09-28 NOTE — Progress Notes (Signed)
Spoke with pt and notified of results per Dr. Wert. Pt verbalized understanding and denied any questions. 

## 2016-09-29 LAB — RESPIRATORY ALLERGY PROFILE REGION II ~~LOC~~
ALLERGEN, COTTONWOOD, T14: 2.56 kU/L — AB
ALLERGEN, D PTERNOYSSINUS, D1: 10.4 kU/L — AB
ALLERGEN, MULBERRY, T70: 1.52 kU/L — AB
Allergen, A. alternata, m6: <0.1 kU/L
Allergen, Cedar tree, t12: 8.79 kU/L — ABNORMAL HIGH
Allergen, Comm Silver Birch, t9: 67.2 kU/L — ABNORMAL HIGH
Allergen, Mouse Urine Protein, e78: <0.1 kU/L
Allergen, Oak,t7: 57.5 kU/L — ABNORMAL HIGH
Aspergillus fumigatus, m3: <0.1 kU/L
BERMUDA GRASS: 1.98 kU/L — AB
BOX ELDER: 3.41 kU/L — AB
COCKROACH: 0.75 kU/L — AB
COMMON RAGWEED: 2.1 kU/L — AB
Cat Dander: 25.1 kU/L — ABNORMAL HIGH
D. FARINAE: 9.72 kU/L — AB
DOG DANDER: 5.36 kU/L — AB
ELM IGE: 2.64 kU/L — AB
IgE (Immunoglobulin E), Serum: 324 kU/L — ABNORMAL HIGH (ref <115)
JOHNSON GRASS: 2.96 kU/L — AB
Pecan/Hickory Tree IgE: 5.94 kU/L — ABNORMAL HIGH
Rough Pigweed  IgE: 1.78 kU/L — ABNORMAL HIGH
SHEEP SORREL IGE: 1.44 kU/L — AB
Timothy Grass: 6.88 kU/L — ABNORMAL HIGH

## 2016-09-29 NOTE — Progress Notes (Signed)
Spoke with pt and notified of results per Dr. Wert. Pt verbalized understanding and denied any questions. 

## 2016-09-29 NOTE — Assessment & Plan Note (Signed)
FENO 09/28/2016  =   18 - Spirometry 09/28/2016  wnl including f/v loop - Allergy profile 09/28/2016 >  Eos 0.4 /  IgE    Symptoms are markedly disproportionate to objective findings and not clear this is a lung problem but pt does appear to have difficult airway management issues. DDX of  difficult airways management almost all start with A and  include Adherence, Ace Inhibitors, Acid Reflux, Active Sinus Disease, Alpha 1 Antitripsin deficiency, Anxiety masquerading as Airways dz,  ABPA,  Allergy(esp in young), Aspiration (esp in elderly), Adverse effects of meds,  Active smokers, A bunch of PE's (a small clot burden can't cause this syndrome unless there is already severe underlying pulm or vascular dz with poor reserve) plus two Bs  = Bronchiectasis and Beta blocker use..and one C= CHF   Adherence is always the initial "prime suspect" and is a multilayered concern that requires a "trust but verify" approach in every patient - starting with knowing how to use medications, especially inhalers, correctly, keeping up with refills and understanding the fundamental difference between maintenance and prns vs those medications only taken for a very short course and then stopped and not refilled.  - - The proper method of use, as well as anticipated side effects, of a metered-dose inhaler are discussed and demonstrated to the patient. Improved effectiveness after extensive coaching during this visit to a level of approximately 75 % from a baseline of 25 % so ok to try symb 80 2bid   ? Allergy > send profile, doubt singulair doing much here and low threshold to stop  ? ABPA > check IgE   ? Anxiety > usually at the bottom of this list of usual suspects but should be much higher on this pt's based on H and P ( esp given resting sob that comes and goes s rx with saba)And may interfere with adherence going forward  ? Active sinus dz > consider CT sinus on return    Total time devoted to counseling  > 50 % of  initial 60 min office visit:  review case with pt/ discussion of options/alternatives/ personally creating written customized instructions  in presence of pt  then going over those specific  Instructions directly with the pt including how to use all of the meds but in particular covering each new medication in detail and the difference between the maintenance= "automatic" meds and the prns using an action plan format for the latter (If this problem/symptom => do that organization reading Left to right).  Please see AVS from this visit for a full list of these instructions which I personally wrote for this pt and  are unique to this visit.

## 2016-11-10 ENCOUNTER — Ambulatory Visit: Payer: Self-pay | Admitting: Internal Medicine

## 2017-01-24 ENCOUNTER — Emergency Department (HOSPITAL_BASED_OUTPATIENT_CLINIC_OR_DEPARTMENT_OTHER)
Admission: EM | Admit: 2017-01-24 | Discharge: 2017-01-24 | Disposition: A | Discharge status: Home / Self Care | Payer: BLUE CROSS/BLUE SHIELD | Attending: Emergency Medicine | Admitting: Emergency Medicine

## 2017-01-24 ENCOUNTER — Emergency Department (HOSPITAL_BASED_OUTPATIENT_CLINIC_OR_DEPARTMENT_OTHER): Payer: BLUE CROSS/BLUE SHIELD

## 2017-01-24 DIAGNOSIS — S62342A Nondisplaced fracture of base of third metacarpal bone, right hand, initial encounter for closed fracture: Secondary | ICD-10-CM | POA: Insufficient documentation

## 2017-01-24 DIAGNOSIS — Z87891 Personal history of nicotine dependence: Secondary | ICD-10-CM | POA: Insufficient documentation

## 2017-01-24 DIAGNOSIS — Y929 Unspecified place or not applicable: Secondary | ICD-10-CM | POA: Diagnosis not present

## 2017-01-24 DIAGNOSIS — Y999 Unspecified external cause status: Secondary | ICD-10-CM | POA: Insufficient documentation

## 2017-01-24 DIAGNOSIS — J45909 Unspecified asthma, uncomplicated: Secondary | ICD-10-CM | POA: Insufficient documentation

## 2017-01-24 DIAGNOSIS — Y939 Activity, unspecified: Secondary | ICD-10-CM | POA: Diagnosis not present

## 2017-01-24 DIAGNOSIS — W228XXA Striking against or struck by other objects, initial encounter: Secondary | ICD-10-CM | POA: Diagnosis not present

## 2017-01-24 DIAGNOSIS — Z79899 Other long term (current) drug therapy: Secondary | ICD-10-CM | POA: Insufficient documentation

## 2017-01-24 DIAGNOSIS — S6991XA Unspecified injury of right wrist, hand and finger(s), initial encounter: Secondary | ICD-10-CM | POA: Diagnosis present

## 2017-01-24 MED ORDER — HYDROCODONE-ACETAMINOPHEN 5-325 MG PO TABS: 1.0000 | ORAL_TABLET | Freq: Four times a day (QID) | ORAL | 0 refills | Status: DC | PRN

## 2017-01-24 MED ORDER — NAPROXEN 250 MG PO TABS
500.0000 mg | ORAL_TABLET | Freq: Once | ORAL | Status: AC
  Administered 2017-01-24: 500 mg via ORAL
  Filled 2017-01-24: qty 2

## 2017-01-24 MED ORDER — HYDROCODONE-ACETAMINOPHEN 5-325 MG PO TABS
1.0000 | ORAL_TABLET | Freq: Once | ORAL | Status: AC
  Administered 2017-01-24: 1 via ORAL
  Filled 2017-01-24: qty 1

## 2017-01-24 NOTE — ED Notes (Signed)
Pt applied ice prior to arrival but denies taking any medications

## 2017-01-24 NOTE — ED Notes (Signed)
Patient transported to X-ray 

## 2017-01-24 NOTE — ED Provider Notes (Signed)
[facility] Provider Note   CSN: [id number] Arrival date & time: 01/24/17  4098     History   Chief Complaint Chief Complaint  Patient presents with  . Hand Pain    HPI Roy Ferguson is a 26 y.o. male.  HPI  This is a 26 year old male who presents with right hand pain. Patient reports remote injury 1 year ago after hitting a wall. He did not seek treatment at that time. 2 weeks ago he reinjured his hand again punching a wall. He has had pain ever since. He is right-handed. Tonight he states that he hit the back of his hand on his girlfriend's knee and had acute increase in pain. Current pain is 8 out of 10. He denies numbness and tingling of the hand.  No past medical history on file.  Patient Active Problem List   Diagnosis Date Noted  . Cough variant asthma  vs UACS 09/28/2016  . 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 Anxiety 01/23/2012    Class: Chronic    No past surgical history on file.     Home Medications    Prior to Admission medications   Medication Sig Start Date End Date Taking? Authorizing Provider  budesonide-formoterol (SYMBICORT) 80-4.5 MCG/ACT inhaler Inhale 2 puffs into the lungs 2 (two) times daily. 09/28/16  Yes Nyoka Cowden, MD  Fluticasone Propionate HFA (FLOVENT HFA IN) Inhale 1 puff into the lungs daily.   Yes [provider]  albuterol (PROVENTIL HFA;VENTOLIN HFA) 108 (90 Base) MCG/ACT inhaler Inhale 1-2 puffs into the lungs every 6 (six) hours as needed for wheezing or shortness of breath.    [provider]  budesonide-formoterol (SYMBICORT) 80-4.5 MCG/ACT inhaler Inhale 2 puffs into the lungs 2 (two) times daily. 09/28/16   Nyoka Cowden, MD  cetirizine (ZYRTEC) 10 MG tablet Take 10 mg by mouth daily.    [provider]  HYDROcodone-acetaminophen (NORCO/VICODIN) 5-325 MG tablet Take 1 tablet by mouth every 6 (six) hours as needed. 01/24/17   Limmie Schoenberg, Mayer Masker, MD    Family  History No family history on file.  Social History Social History  Substance Use Topics  . Smoking status: Former Smoker    Packs/day: 2.00    Years: 1.50    Types: Cigarettes    Quit date: 05/15/2014  . Smokeless tobacco: Never Used  . Alcohol use Yes     Comment: Socially on weekends     Allergies   Apple; Codeine; Dog epithelium; Other; and Sulfa drugs cross reactors   Review of Systems Review of Systems  Musculoskeletal:       Right hand pain  Skin: Negative for wound.  Neurological: Negative for weakness and numbness.  All other systems reviewed and are negative.    Physical Exam Updated Vital Signs BP (!) 132/95 (BP Location: Left Arm)   Pulse 62   Temp 97.8 F (36.6 C) (Oral)   Resp 20   Ht  (1.778 m)   Wt 87.1 kg (192 lb)   SpO2 99%   BMI 27.55 kg/m   Physical Exam  Constitutional: He is oriented to person, place, and time. He appears well-developed and well-nourished. No distress.  HENT:  Head: Normocephalic and atraumatic.  Cardiovascular: Normal rate, regular rhythm and normal heart sounds.   Pulmonary/Chest: Effort normal. No respiratory distress.  Musculoskeletal: He exhibits no edema.  Focused examination of the right hand reveals normal range of motion of the wrists, tenderness  to palpation along the third metacarpal, no obvious deformity, no significant swelling, flexion and extension of the fingers intact, 2+ radial pulse, no snuffbox tenderness  Neurological: He is alert and oriented to person, place, and time.  Skin: Skin is warm and dry.  Psychiatric: He has a normal mood and affect.  Nursing note and vitals reviewed.    ED Treatments / Results  Labs (all labs ordered are listed, but only abnormal results are displayed) Labs Reviewed - No data to display  EKG  EKG Interpretation None       Radiology Dg Hand Complete Right  Result Date: 01/24/2017 CLINICAL DATA:  Right hand pain after injury. EXAM: RIGHT HAND - COMPLETE  3+ VIEW COMPARISON:  None. FINDINGS: Transverse fracture base of third metacarpal without definite intra-articular extension. No significant displacement. There is associated dorsal soft tissue edema. No additional fracture of the hand. Alignment is maintained. IMPRESSION: Transverse nondisplaced fracture base of the third metacarpal. Electronically Signed   By: Rubye OaksMelanie  Ehinger M.D.   On: 01/24/2017 06:02    Procedures .Splint Application Date/Time: 01/24/2017 6:37 AM Performed by: Shon BatonHORTON, Janeese Mcgloin F Authorized by: Shon BatonHORTON, Kein Carlberg F   Consent:    Consent obtained:  Verbal   Consent given by:  Patient   Alternatives discussed:  No treatment Pre-procedure details:    Sensation:  Normal Procedure details:    Laterality:  Right   Location:  Hand   Hand:  R hand   Strapping: no     Cast type:  Short arm   Splint type: burkhalter-type.   Supplies:  Prefabricated splint and cotton padding Post-procedure details:    Pain:  Unchanged   Sensation:  Normal   Patient tolerance of procedure:  Tolerated well, no immediate complications   (including critical care time)  Medications Ordered in ED Medications  naproxen (NAPROSYN) tablet 500 mg (500 mg Oral Given 01/24/17 0544)     Initial Impression / Assessment and Plan / ED Course  I have reviewed the triage vital signs and the nursing notes.  Pertinent labs & imaging results that were available during my care of the patient were reviewed by me and considered in my medical decision making (see chart for details).     Patient presents with right hand pain. Reports remote injury followed by recurrent injury 2 weeks ago with worsening pain tonight. He is neurovascularly intact. Patient initially given naproxen. X-ray obtained and shows a transverse nondisplaced fracture of the base of the third metacarpal. Suspect this may be 72 weeks old at this point given history.  Patient was placed in a splint. Follow-up with and surgery in one  week.  After history, exam, and medical workup I feel the patient has been appropriately medically screened and is safe for discharge home. Pertinent diagnoses were discussed with the patient. Patient was given return precautions.   Final Clinical Impressions(s) / ED Diagnoses   Final diagnoses:  Closed nondisplaced fracture of base of third metacarpal bone of right hand, initial encounter    New Prescriptions New Prescriptions   HYDROCODONE-ACETAMINOPHEN (NORCO/VICODIN) 5-325 MG TABLET    Take 1 tablet by mouth every 6 (six) hours as needed.     Shon BatonHorton, Yang Rack F, MD 01/24/17 [phone]

## 2017-01-24 NOTE — Discharge Instructions (Signed)
You were seen today and had a fracture of the right hand. This may be 82 weeks old given mechanism. Follow-up with hand surgeon within 1 week. Call for appointment. Keep hand immobilized in splint until follow-up.

## 2017-01-24 NOTE — ED Triage Notes (Signed)
Pt reports hitting a wall over a year ago and never getting x-rays, pt re-injured the hand 2 weeks ago by hitting a wall again. Tonight pt was moving his hands and hit the top of his hand on his gfs knee. Pt has noted swelling to knuckles. Pt report pain has increased tonight.

## 2017-01-24 NOTE — ED Notes (Signed)
ED Provider at bedside. 

## 2017-06-15 ENCOUNTER — Emergency Department (HOSPITAL_BASED_OUTPATIENT_CLINIC_OR_DEPARTMENT_OTHER)
Admission: EM | Admit: 2017-06-15 | Discharge: 2017-06-15 | Disposition: A | Discharge status: Home / Self Care | Payer: BLUE CROSS/BLUE SHIELD | Attending: Emergency Medicine | Admitting: Emergency Medicine

## 2017-06-15 ENCOUNTER — Encounter (HOSPITAL_BASED_OUTPATIENT_CLINIC_OR_DEPARTMENT_OTHER): Payer: Self-pay | Admitting: Emergency Medicine

## 2017-06-15 ENCOUNTER — Other Ambulatory Visit: Payer: Self-pay

## 2017-06-15 DIAGNOSIS — Y998 Other external cause status: Secondary | ICD-10-CM | POA: Diagnosis not present

## 2017-06-15 DIAGNOSIS — F1210 Cannabis abuse, uncomplicated: Secondary | ICD-10-CM | POA: Diagnosis not present

## 2017-06-15 DIAGNOSIS — Z87891 Personal history of nicotine dependence: Secondary | ICD-10-CM | POA: Insufficient documentation

## 2017-06-15 DIAGNOSIS — S0990XA Unspecified injury of head, initial encounter: Secondary | ICD-10-CM | POA: Insufficient documentation

## 2017-06-15 DIAGNOSIS — Y929 Unspecified place or not applicable: Secondary | ICD-10-CM | POA: Diagnosis not present

## 2017-06-15 DIAGNOSIS — Z79899 Other long term (current) drug therapy: Secondary | ICD-10-CM | POA: Insufficient documentation

## 2017-06-15 DIAGNOSIS — Y9301 Activity, walking, marching and hiking: Secondary | ICD-10-CM | POA: Diagnosis not present

## 2017-06-15 DIAGNOSIS — W01198A Fall on same level from slipping, tripping and stumbling with subsequent striking against other object, initial encounter: Secondary | ICD-10-CM | POA: Insufficient documentation

## 2017-06-15 DIAGNOSIS — R51 Headache: Secondary | ICD-10-CM | POA: Insufficient documentation

## 2017-06-15 DIAGNOSIS — R110 Nausea: Secondary | ICD-10-CM | POA: Diagnosis not present

## 2017-06-15 DIAGNOSIS — R42 Dizziness and giddiness: Secondary | ICD-10-CM | POA: Diagnosis not present

## 2017-06-15 DIAGNOSIS — J45909 Unspecified asthma, uncomplicated: Secondary | ICD-10-CM | POA: Insufficient documentation

## 2017-06-15 HISTORY — DX: Anxiety disorder, unspecified: F41.9

## 2017-06-15 HISTORY — DX: Unspecified asthma, uncomplicated: J45.909

## 2017-06-15 MED ORDER — ONDANSETRON 4 MG PO TBDP: 4.0000 mg | ORAL_TABLET | Freq: Three times a day (TID) | ORAL | 0 refills | Status: DC | PRN

## 2017-06-15 MED ORDER — NAPROXEN 500 MG PO TABS: 500.0000 mg | ORAL_TABLET | Freq: Two times a day (BID) | ORAL | 0 refills | Status: DC

## 2017-06-15 NOTE — ED Triage Notes (Addendum)
[facility] and subsequently hit the right side of his head on a door.  Denies LOC.  No laceration or hematoma noted.  Alert and oriented and ambulatory to triage in NAD.  Reports he took CBD oil prior to arrival and that has relieved his pain although c/o pain with movement.

## 2017-06-15 NOTE — ED Notes (Addendum)
Pt states he has a concussion,  States he has had several and he knows what they feel like,  States he hit rt side of head on door frame and since then has had nausea, dizziness, temperature changes, neck tight and pain to base of skull  Denies loc

## 2017-06-15 NOTE — ED Provider Notes (Signed)
[facility] EMERGENCY DEPARTMENT Provider Note   CSN: [id number] Arrival date & time: 06/15/17  1810     History   Chief Complaint Chief Complaint  Patient presents with  . Head Injury    HPI Roy Ferguson is a 27 y.o. male.  HPI   Roy Ferguson is a 27 y.o. male, with a history of anxiety and asthma, presenting to the ED with head injury that occurred around 11 AM today.  States he slipped on a door mat and hit the right side of his head on the door frame.  He is currently complaining of a generalized headache, worse on the right, throbbing, moderate, nonradiating.  Accompanied by "foggy headedness" and nausea.  States he "smoked CBD" and felt better. Denies LOC, vomiting, neck/back pain, neurologic deficits, vision abnormalities, amnesia, or any other complaints. Denies anticoagulation.    Past Medical History:  Diagnosis Date  . Anxiety   . Asthma     Patient Active Problem List   Diagnosis Date Noted  . Cough variant asthma  vs UACS 09/28/2016  . 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 Anxiety 01/23/2012    Class: Chronic    History reviewed. No pertinent surgical history.     Home Medications    Prior to Admission medications   Medication Sig Start Date End Date Taking? Authorizing Provider  albuterol (PROVENTIL HFA;VENTOLIN HFA) 108 (90 Base) MCG/ACT inhaler Inhale 1-2 puffs into the lungs every 6 (six) hours as needed for wheezing or shortness of breath.    [provider]  budesonide-formoterol (SYMBICORT) 80-4.5 MCG/ACT inhaler Inhale 2 puffs into the lungs 2 (two) times daily. 09/28/16   Nyoka Cowden, MD  budesonide-formoterol (SYMBICORT) 80-4.5 MCG/ACT inhaler Inhale 2 puffs into the lungs 2 (two) times daily. 09/28/16   Nyoka Cowden, MD  cetirizine (ZYRTEC) 10 MG tablet Take 10 mg by mouth daily.    [provider]  Fluticasone Propionate HFA (FLOVENT HFA IN) Inhale 1 puff into the lungs daily.     [provider]  HYDROcodone-acetaminophen (NORCO/VICODIN) 5-325 MG tablet Take 1 tablet by mouth every 6 (six) hours as needed. 01/24/17   Horton, Mayer Masker, MD  naproxen (NAPROSYN) 500 MG tablet Take 1 tablet (500 mg total) by mouth 2 (two) times daily. 06/15/17   Joy, Shawn C, PA-C  ondansetron (ZOFRAN ODT) 4 MG disintegrating tablet Take 1 tablet (4 mg total) by mouth every 8 (eight) hours as needed for nausea or vomiting. 06/15/17   Anselm Pancoast, PA-C    Family History History reviewed. No pertinent family history.  Social History Social History   Tobacco Use  . Smoking status: Former Smoker    Packs/day: 2.00    Years: 1.50    Pack years: 3.00    Types: Cigarettes    Last attempt to quit: 05/15/2014    Years since quitting: 3.0  . Smokeless tobacco: Never Used  Substance Use Topics  . Alcohol use: Yes    Comment: Socially on weekends  . Drug use: Yes    Frequency: 7.0 times per week    Types: Marijuana     Allergies   Apple; Codeine; Dog epithelium; Other; and Sulfa drugs cross reactors   Review of Systems Review of Systems  Constitutional: Negative for diaphoresis.  Eyes: Negative for visual disturbance.  Respiratory: Negative for shortness of breath.   Cardiovascular: Negative for chest pain.  Gastrointestinal: Positive for nausea. Negative for abdominal pain  and vomiting.  Musculoskeletal: Negative for back pain and neck pain.  Neurological: Positive for headaches. Negative for dizziness, seizures, syncope, speech difficulty, weakness, light-headedness and numbness.  All other systems reviewed and are negative.    Physical Exam Updated Vital Signs BP 135/83   Pulse 88   Temp 98 F (36.7 C) (Oral)   Resp 16   Ht 5\' 10"  (1.778 m)   Wt 91.2 kg (201 lb)   SpO2 97%   BMI 28.84 kg/m   Physical Exam  Constitutional: He is oriented to person, place, and time. He appears well-developed and well-nourished. No distress.  HENT:  Head: Normocephalic.    Mouth/Throat: Oropharynx is clear and moist.  Tenderness with mild appearing abrasion to the right parietal scalp without associated swelling, laceration, hematoma, instability, or deformity.  No noted hemorrhage.  No noted evidence of facial trauma.  Eyes: Conjunctivae and EOM are normal. Pupils are equal, round, and reactive to light.  Neck: Normal range of motion. Neck supple.  Cardiovascular: Normal rate, regular rhythm, normal heart sounds and intact distal pulses.  Pulmonary/Chest: Effort normal and breath sounds normal. No respiratory distress.  Abdominal: Soft. There is no tenderness. There is no guarding.  Musculoskeletal: He exhibits no edema.  Normal motor function intact in all extremities and spine. No midline spinal tenderness.   Neurological: He is alert and oriented to person, place, and time.  No sensory deficits.  No noted speech deficits. No aphasia. Patient handles oral secretions without difficulty. No noted swallowing defects.  Equal grip strength bilaterally. Strength 5/5 in the upper extremities. Strength 5/5 with flexion and extension of the hips, knees, and ankles bilaterally.  Patellar DTRs 2+ bilaterally. Negative Romberg. No gait disturbance.  Coordination intact including heel to shin and finger to nose.  Cranial nerves III-XII grossly intact.  No facial droop.   Skin: Skin is warm and dry. He is not diaphoretic.  Psychiatric: He has a normal mood and affect. His behavior is normal.  Nursing note and vitals reviewed.    ED Treatments / Results  Labs (all labs ordered are listed, but only abnormal results are displayed) Labs Reviewed - No data to display  EKG  EKG Interpretation None       Radiology No results found.  Procedures Procedures (including critical care time)  Medications Ordered in ED Medications - No data to display   Initial Impression / Assessment and Plan / ED Course  I have reviewed the triage vital signs and the  nursing notes.  Pertinent labs & imaging results that were available during my care of the patient were reviewed by me and considered in my medical decision making (see chart for details).      Patient presents following a head injury that occurred a number of hours prior to my evaluation.  Symptoms described are consistent with minor head injury.  No neurologic deficits.  [place] head and C-spine CT criterias were utilized in the decision-making for this patient and recommend no CT.  I concur with this based on my evaluation of the patient.  PCP or concussion clinic follow-up.  Resources given. The patient was given instructions for home care as well as return precautions. Patient voices understanding of these instructions, accepts the plan, and is comfortable with discharge.   Final Clinical Impressions(s) / ED Diagnoses   Final diagnoses:  Injury of head, initial encounter    ED Discharge Orders        Ordered    naproxen (NAPROSYN)  500 MG tablet  2 times daily     06/15/17 2015    ondansetron (ZOFRAN ODT) 4 MG disintegrating tablet  Every 8 hours PRN     06/15/17 2015       Anselm PancoastJoy, Shawn C, PA-C 06/16/17 2023    Gwyneth SproutPlunkett, Whitney, MD 06/17/17 2200

## 2017-06-15 NOTE — Discharge Instructions (Signed)
°  Head Injury You have been seen today for a head injury. It does not appear to be serious at this time.  Close observation: The close observation period is usually 6 hours from the injury. This includes staying awake and having a trustworthy adult monitor you to assure your condition does not worsen. You should be in regular contact with this person and ideally, they should be able to monitor you in person.  Secondary observation: The secondary observation period is usually 24 hours from the injury. You are allowed to sleep during this time. A trustworthy adult should intermittently monitor you to assure your condition does not worsen.   Overall head injury/concussion care: Rest: Be sure to get plenty of rest. You will need more rest and sleep while you recover. Hydration: Be sure to stay well hydrated by having a goal of drinking about 0.5 liters of water an hour. Pain:  Antiinflammatory medications: Take 600 mg of ibuprofen every 6 hours or 440 mg (over the counter dose) to 500 mg (prescription dose) of naproxen every 12 hours or for the next 3 days. After this time, these medications may be used as needed for pain. Take these medications with food to avoid upset stomach. Choose only one of these medications, do not take them together. Tylenol: Should you continue to have additional pain while taking the ibuprofen or naproxen, you may add in tylenol as needed. Your daily total maximum amount of tylenol from all sources should be limited to 4000mg /day for persons without liver problems, or 2000mg /day for those with liver problems. Zofran: May take the Zofran as needed for nausea. Return to sports and activities: In general, you may return to normal activities once symptoms have subsided, however, you would ideally be cleared by a primary care provider or other qualified medical professional prior to return to these activities.  Follow up: Follow up with the concussion clinic or your primary care  provider for further management of this issue. Return: Return to the ED should you begin to have confusion, abnormal behavior, aggression, violence, or personality changes, repeated vomiting, vision loss, numbness or weakness on one side of the body, difficulty standing due to dizziness, significantly worsening pain, or any other major concerns.

## 2017-06-24 NOTE — Progress Notes (Deleted)
Subjective:   @VITALSMCOMMENTS @  Chief Complaint: Roy Ferguson, DOB: 11/03/1990, is a 27 y.o. male who presents for No chief complaint on file.   Injury date : *** Visit #: ***  History of Present Illness:   Patient's goals/priorities: ***  CLASS CURRENT GRADE COMMENTS                               Concussion Self-Reported Symptom Score Symptoms rated on a scale 1-6, in last 24 hours  Headache: ***    Nausea: ***  Vomiting: ***  Balance Difficulty: ***   Dizziness: ***  Fatigue: ***  Trouble Falling Asleep: ***   Sleep More Than Usual: ***  Sleep Less Than Usual: ***  Daytime Drowsiness: ***  Photophobia: ***  Phonophobia: ***  Feeling anxious: ***  Confused: ***  Irritability: ***  Sadness: ***  Nervousness: ***  Feeling More Emotional: ***  Numbness or Tingling: ***  Feeling Slowed Down: ***  Feeling Mentally Foggy: ***  Difficulty Concentrating: ***  Difficulty Remembering: ***  Visual Problems: ***  Neck Pain: ***  Tinnitus: ***   Total Symptom Score: *** Previous Symptom Score: ***  Review of Systems: Pertinent items are noted in HPI.  Review of History: Past Medical History: @PMHP @  Past Surgical History:  has no past surgical history on file. Family History: family history is not on file. Social History:  reports that he quit smoking about 3 years ago. His smoking use included cigarettes. He has a 3.00 pack-year smoking history. he has never used smokeless tobacco. He reports that he drinks alcohol. He reports that he uses drugs. Drug: Marijuana. Frequency: 7.00 times per week. Current Medications: has a current medication list which includes the following prescription(s): albuterol, budesonide-formoterol, budesonide-formoterol, cetirizine, fluticasone propionate hfa, hydrocodone-acetaminophen, naproxen, and ondansetron. Allergies: is allergic to apple; codeine; dog epithelium; other; and sulfa drugs cross reactors.  Objective:    Physical  Examination There were no vitals filed for this visit. General appearance: alert, appears stated age and cooperative Head: Normocephalic, without obvious abnormality, atraumatic Eyes: conjunctivae/corneas clear. PERRL, EOM's intact. Fundi benign. Sclera anicteric. Lungs: clear to auscultation bilaterally and percussion Heart: regular rate and rhythm, S1, S2 normal, no murmur, click, rub or gallop Neurologic: CN 2-12 normal.  Sensation to pain, touch, and proprioception normal.  DTRs  normal in upper and lower extremities. No pathologic reflexes. Neg rhomberg, modified rhomberg, pronator drift, tandem gait, finger-to-nose; see post-concussion vestibular and oculomotor testing in chart Psychiatric: Oriented X3, intact recent and remote memory, judgement and insight, normal mood and affect  Concussion testing performed today:  Neurocognitive testing (ImPACT):  Baseline:*** Post #1: ***   Verbal Memory Composite *** (***%) *** (***%)   Visual Memory Composite *** (***%) *** (***%)   Visual Motor Speed Composite *** (***%) *** (***%)   Reaction Time Composite *** (***%) *** (***%)   Cognitive Efficiency Index *** ***     Vestibular Screening:   Pre VOMS  HA Score: *** Pre VOMS  Dizziness Score: ***   Headache  Dizziness  Smooth Pursuits *** ***  H. Saccades *** ***  V. Saccades *** ***  H. VOR *** ***  V. VOR *** ***  Visual Motor Sensitivity *** ***  Accommodation Right: *** cm Left: *** cm *** ***  Convergence: *** cm Divergence: *** cm *** ***   Balance Screen: ***  Additional testing performed today: { :28529}   Assessment:    No  diagnosis found.  Roy Ferguson presents with the following concussion subtypes. [] Cognitive [] Cervical [] Vestibular [] Ocular [] Migraine [] Anxiety/Mood   Plan:   Action/Discussion: Reviewed diagnosis, management options, expected outcomes, and the reasons for scheduled and emergent follow-up. Questions were adequately answered. Patient  expressed verbal understanding and agreement with the following plan.      Participation in school/work: Patient is cleared to return to work/school and activities of daily living without restrictions.  Patient is not cleared to return to work/school until further notice.  Patient may return to work/school on ***, with the following restrictions/supports:  Length of Day:  Please allow patient to use *** class as study hall in a quiet area.  Shortened school/work day: Recommend *** until ***.  Recommend core classes only.  Extra Time:  Take mental rest breaks during the day as needed. Check for return of symptoms when participating in any activities that require a significant amount of attention or concentration.  Allow extra time to complete tasks.  Please allow *** weeks to make up missed assignments, test, quizzes.  Visual/Vestibular Accommodations in School:  Allow patient to eat lunch in quiet environment with 1-2 classmates.  Allow patient to leave class 5 minutes before end of period to avoid busy/noisy hallway.  Please provide any supplemental learning materials (power points, lecture notes, handouts, etc) in minimum size 18 font and allow/provide any auditory supplements to learning when possible (books on tape, audio tape lectures, etc) to limit visual stress in the classroom.  Patient is cleared for auditory participation only. Patient is not cleared for homework, quizzes, or tests at this time.   Testing:  May begin taking tests/quizzes on *** with no more than one test/quiz per day.   No significant classroom or standardized testing until ***.  Home/Extracurricular:  Lessen work/homework load to allow adequate cognitive rest. Work *** minutes with intervals of *** minute breaks (total *** hours).  Limit visual stimulants including: driving, watching television/movies, reading, using cell phone, etc. - to ensure relative visual cognitive rest. NOT cleared for  video or phone games. May participate *** minutes with intervals of *** minute breaks (total *** hours).   Participation in physical activity: Patient is cleared to return to physical activity participation without restrictions.  Patient is not cleared for formal physical activity (includes physical education class, sports practices, sports games, weight training, etc) at this time.   However, we recommend that patient has 20-30 minutes of light cardiovascular activity daily, with NO risk of head injury (example: walking), staying below level of symptoms.  Recommend gradual progression to *** vestibular exercise (30-45 min/day) with no risk of head trauma while staying below level of symptoms.  See your exercise treatment menu for details.   Begin your exercise prescription on ____________ (see separate exercise prescription form)  Cleared for physical activity that poses NO RISK of head trauma.   Gradual return to physical activity under the supervision of a physician and/or athletic trainer  Once asymptomatic for 24 hours, patient may start Stage *** of CFPSM's { :10024} Exercise Progression Protocol. This is to be monitored by patient's { :28383}    Patient may start Stage *** of CFPSM's { :10024} Exercise Progression Protocol. This is to be monitored by patient's { :28383}.   Patient is not cleared for full contact activities, activities with risk of head trauma or unsupervised physical activity while participating in CFPSM's Exercise Progression. Check for return of symptoms (using Concussion Education Form Symptom List) when participating in activity and 24  hours following. If symptoms return, patient to contact our office for further recommendations.    [facility] Medical Recommendations form has been given to patient allowing *** to progress patient back into full participation.  Active Treatment Strategies:  Fueling your brain is important for recovery. It is essential to stay well  hydrated, aiming for half of your body weight in fluid ounces per day (100 lbs = 50 oz). We also recommend eating breakfast to start your day and focus on a well-balanced diet containing lean protein, 'good' fats, and complex carbohydrates. See your nutrition / hydration handout for more details.   Quality sleep is vital in your concussion recovery. We encourage lots of sleep for the first 24-72 hours after injury but following this period it is important to regulate your sleep cycle. We encourage *** hours of quality sleep per night. See your sleep handout for more details and strategies to quality sleep.  IF NOT USING THE OPTIONS BELOW DELETE THEM  Treating your vestibular and visual dysfunction will decrease your recovery time and improve your symptoms. Begin your home vestibular exercise program as directed on your AVS.    Begin taking Amantadine medicine as directed.   Begin taking DHA supplement as directed.    Begin home exercise program for neck as directed.   Follow-up information:  Follow up appointment at [facility] in *** .   Call [facility] at [phone] at least 24 hours after completion of Stage 4 with status update.  Patient needs to arrive 30 minutes prior to appointment to complete the following tests: { :28378}.    Patient Education:  Reviewed with patient the risks (i.e, a repeat concussion, post-concussion syndrome, second-impact syndrome) of returning to play prior to complete resolution, and thoroughly reviewed the signs and symptoms of concussion.Reviewed need for complete resolution of all symptoms, with rest AND exertion, prior to return to play.  Reviewed red flags for urgent medical evaluation: worsening symptoms, nausea/vomiting, intractable headache, musculoskeletal changes, focal neurological deficits.  [facility]'s Concussion Care Plan, which clearly outlines the plans stated above, was given to patient.  I was  personally involved with the physical evaluation of and am in agreement with the assessment and treatment plan for this patient.  Greater than 50% of this encounter was spent in direct consultation with the patient in evaluation, counseling, and coordination of care. Duration of encounter: { :28531} minutes.  After Visit Summary printed out and provided to patient as appropriate.  This note is written by Judi SaaZachary M Smith, in the presence of and acting as the scribe of Judi SaaZachary M Smith, DO.

## 2017-06-25 ENCOUNTER — Ambulatory Visit: Payer: BLUE CROSS/BLUE SHIELD | Admitting: Family Medicine

## 2017-06-25 ENCOUNTER — Ambulatory Visit: Payer: Self-pay | Admitting: Family Medicine

## 2017-06-25 ENCOUNTER — Telehealth: Payer: Self-pay

## 2017-06-25 NOTE — Telephone Encounter (Signed)
Spoke with patient. He was at a restaurant and slipped on a mat going out a door. He hit his head on the door as well as his right hip and shoulder. He did not lose consciousness but did feel groggy, had a headache and was photophobic and phonophobic. His headaches have gotten better but he is still having them. He does attend [employer] but has not gone to school since the injury on 06/15/17.

## 2017-06-27 NOTE — Progress Notes (Signed)
Subjective:   I, Roy Ferguson, am serving as a scribe for Dr. Antoine Primas, DO.  Chief Complaint: Roy Ferguson, DOB: 26-Jan-1991, is a 27 y.o. male who presents for head injury sustained on 06/15/17. He tripped on a floor mat at a restaurant and hit the right side of his head. He did not lose consciousness. Patient did experiencing headache and fogginess. 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 Patient has not been back to school. Is starting a new job next week at [employer]. Patient states that his headaches have gone away and that the headache that he is having is from his sinuses.    Injury date : 06/15/17 Visit #: 1  History of Present Illness:   Patient's goals/priorities: Return to baseline  Concussion Self-Reported Symptom Score Symptoms rated on a scale 1-6, in last 24 hours  Headache: 1  Nausea: 0  Vomiting:0  Balance Difficulty:0   Dizziness: 0  Fatigue: 0  Trouble Falling Asleep: 0   Sleep More Than Usual: 2  Sleep Less Than Usual: 0  Daytime Drowsiness:0  Photophobia:0  Phonophobia: 0  Irritability: 0  Sadness: 0  Nervousness: 0  Feeling More Emotional: 0  Numbness or Tingling: 0  Feeling Slowed Down:0  Feeling Mentally Foggy:0  Difficulty Concentrating: 1  Difficulty Remembering: 0  Visual Problems: 0    Total Symptom Score: 4   Review of Systems: Pertinent items are noted in HPI.  Review of History: Past Medical History:  Past Medical History:  Diagnosis Date  . Anxiety   . Asthma     Past Surgical History:  has no past surgical history on file. Family History: family history is not on file. Social History:  reports that he quit smoking about 3 years ago. His smoking use included cigarettes. He has a 3.00 pack-year smoking history. he has never used smokeless tobacco. He reports that he drinks alcohol. He reports that he uses drugs. Drug: Marijuana. Frequency: 7.00 times per week. Current Medications: has a  current medication list which includes the following prescription(s): albuterol, budesonide-formoterol, budesonide-formoterol, cetirizine, fluticasone propionate hfa, hydrocodone-acetaminophen, naproxen, and ondansetron. Allergies: is allergic to apple; codeine; dog epithelium; other; and sulfa drugs cross reactors.  Objective:    Physical Examination Vitals:   06/28/17 1333  BP: 138/82  Pulse: 77   General appearance: alert, appears stated age and cooperative Head: Normocephalic, without obvious abnormality, atraumatic Eyes: conjunctivae/corneas clear. PERRL, EOM's intact. Fundi benign. Sclera anicteric. Lungs: clear to auscultation bilaterally and percussion Heart: regular rate and rhythm, S1, S2 normal, no murmur, click, rub or gallop Neurologic: CN 2-12 normal.  Sensation to pain, touch, and proprioception normal.  DTRs  normal in upper and lower extremities. No pathologic reflexes. Neg rhomberg, modified rhomberg, pronator drift, tandem gait, finger-to-nose; see post-concussion vestibular and oculomotor testing in chart Psychiatric: Oriented X3, intact recent and remote memory, judgement and insight, normal mood and affect  Concussion testing performed today: Patient has had some difficulty with reaction time the patient is stating that he has been smoking cannabis recently.  Neurocognitive testing (ImPACT):   Post #1:   Verbal Memory Composite  83 (44%)   Visual Memory Composite  89 (87%)   Visual Motor Speed Composite  37.58 (34%)   Reaction Time Composite  .81 (2%)   Cognitive Efficiency Index  .25    Vestibular Screening:   Pre VOMS  HA Score:0 Pre VOMS  Dizziness Score: 0   Headache  Dizziness  Smooth Pursuits n n  H. Saccades n n  V. Saccades n n  H. VOR n n  V. VOR n n  Visual Motor Sensitivity n n      Convergence: 9cm  n n       Plan:   Action/Discussion: Reviewed diagnosis, management options, expected outcomes, and the reasons for scheduled and  emergent follow-up. Questions were adequately answered. Patient expressed verbal understanding and agreement with the following plan.     Follow-up information:  Follow up appointment at [facility] in 2 weeks  Call [facility] at [phone] at least 24 hours after completion of Stage 4 with status update.  Patient Education:  Reviewed with patient the risks (i.e, a repeat concussion, post-concussion syndrome, second-impact syndrome) of returning to play prior to complete resolution, and thoroughly reviewed the signs and symptoms of concussion.Reviewed need for complete resolution of all symptoms, with rest AND exertion, prior to return to play.  Reviewed red flags for urgent medical evaluation: worsening symptoms, nausea/vomiting, intractable headache, musculoskeletal changes, focal neurological deficits.  [facility]'s Concussion Care Plan, which clearly outlines the plans stated above, was given to patient.  I was personally involved with the physical evaluation of and am in agreement with the assessment and treatment plan for this patient.  Greater than 50% of this encounter was spent in direct consultation with the patient in evaluation, counseling, and coordination of care. Duration of encounter: 65minutes.  After Visit Summary printed out and provided to patient as appropriate.

## 2017-06-28 ENCOUNTER — Encounter: Payer: Self-pay | Admitting: Family Medicine

## 2017-06-28 ENCOUNTER — Ambulatory Visit: Payer: BLUE CROSS/BLUE SHIELD | Admitting: Family Medicine

## 2017-06-28 DIAGNOSIS — S0990XA Unspecified injury of head, initial encounter: Secondary | ICD-10-CM

## 2017-06-28 NOTE — Assessment & Plan Note (Signed)
Patient did have head injury.  I do not see any of the patient's lower test scores are secondary to potential substance abuse.  We discussed the patient would like any type of care for that.  Feels like he is doing relatively well.  We discussed with patient about over-the-counter medications that could be beneficial.  Discussed avoiding the pain medications.  Patient will follow up with me in 2 weeks to make sure that all symptoms has completely resolved.

## 2017-06-28 NOTE — Patient Instructions (Signed)
Good to see you  Fish oil 2 grams daily  Vitamin D 2000 IU daily  See me again in 2 weeks if not perfect

## 2017-07-11 NOTE — Progress Notes (Deleted)
Subjective:   @VITALSMCOMMENTS@  Chief Complaint: Roy Ferguson, DOB: 11/17/1990, is a 26 y.o. male who presents for No chief complaint on file.   Injury date : *** Visit #: ***  History of Present Illness:   Patient's goals/priorities: ***  CLASS CURRENT GRADE COMMENTS                               Concussion Self-Reported Symptom Score Symptoms rated on a scale 1-6, in last 24 hours  Headache: ***    Nausea: ***  Vomiting: ***  Balance Difficulty: ***   Dizziness: ***  Fatigue: ***  Trouble Falling Asleep: ***   Sleep More Than Usual: ***  Sleep Less Than Usual: ***  Daytime Drowsiness: ***  Photophobia: ***  Phonophobia: ***  Feeling anxious: ***  Confused: ***  Irritability: ***  Sadness: ***  Nervousness: ***  Feeling More Emotional: ***  Numbness or Tingling: ***  Feeling Slowed Down: ***  Feeling Mentally Foggy: ***  Difficulty Concentrating: ***  Difficulty Remembering: ***  Visual Problems: ***  Neck Pain: ***  Tinnitus: ***   Total Symptom Score: *** Previous Symptom Score: ***  Review of Systems: Pertinent items are noted in HPI.  Review of History: Past Medical History: @PMHP@  Past Surgical History:  has no past surgical history on file. Family History: family history is not on file. Social History:  reports that he quit smoking about 3 years ago. His smoking use included cigarettes. He has a 3.00 pack-year smoking history. he has never used smokeless tobacco. He reports that he drinks alcohol. He reports that he uses drugs. Drug: Marijuana. Frequency: 7.00 times per week. Current Medications: has a current medication list which includes the following prescription(s): albuterol, budesonide-formoterol, budesonide-formoterol, cetirizine, fluticasone propionate hfa, hydrocodone-acetaminophen, naproxen, and ondansetron. Allergies: is allergic to apple; codeine; dog epithelium; other; and sulfa drugs cross reactors.  Objective:    Physical  Examination There were no vitals filed for this visit. General appearance: alert, appears stated age and cooperative Head: Normocephalic, without obvious abnormality, atraumatic Eyes: conjunctivae/corneas clear. PERRL, EOM's intact. Fundi benign. Sclera anicteric. Lungs: clear to auscultation bilaterally and percussion Heart: regular rate and rhythm, S1, S2 normal, no murmur, click, rub or gallop Neurologic: CN 2-12 normal.  Sensation to pain, touch, and proprioception normal.  DTRs  normal in upper and lower extremities. No pathologic reflexes. Neg rhomberg, modified rhomberg, pronator drift, tandem gait, finger-to-nose; see post-concussion vestibular and oculomotor testing in chart Psychiatric: Oriented X3, intact recent and remote memory, judgement and insight, normal mood and affect  Concussion testing performed today:  Neurocognitive testing (ImPACT):  Baseline:*** Post #1: ***   Verbal Memory Composite *** (***%) *** (***%)   Visual Memory Composite *** (***%) *** (***%)   Visual Motor Speed Composite *** (***%) *** (***%)   Reaction Time Composite *** (***%) *** (***%)   Cognitive Efficiency Index *** ***     Vestibular Screening:   Pre VOMS  HA Score: *** Pre VOMS  Dizziness Score: ***   Headache  Dizziness  Smooth Pursuits *** ***  H. Saccades *** ***  V. Saccades *** ***  H. VOR *** ***  V. VOR *** ***  Visual Motor Sensitivity *** ***  Accommodation Right: *** cm Left: *** cm *** ***  Convergence: *** cm Divergence: *** cm *** ***   Balance Screen: ***  Additional testing performed today: { :28529}   Assessment:    No   diagnosis found.  Roy Ferguson presents with the following concussion subtypes. []Cognitive []Cervical []Vestibular []Ocular []Migraine []Anxiety/Mood   Plan:   Action/Discussion: Reviewed diagnosis, management options, expected outcomes, and the reasons for scheduled and emergent follow-up. Questions were adequately answered. Patient  expressed verbal understanding and agreement with the following plan.      Participation in school/work: Patient is cleared to return to work/school and activities of daily living without restrictions.  Patient is not cleared to return to work/school until further notice.  Patient may return to work/school on ***, with the following restrictions/supports:  Length of Day:  Please allow patient to use *** class as study hall in a quiet area.  Shortened school/work day: Recommend *** until ***.  Recommend core classes only.  Extra Time:  Take mental rest breaks during the day as needed. Check for return of symptoms when participating in any activities that require a significant amount of attention or concentration.  Allow extra time to complete tasks.  Please allow *** weeks to make up missed assignments, test, quizzes.  Visual/Vestibular Accommodations in School:  Allow patient to eat lunch in quiet environment with 1-2 classmates.  Allow patient to leave class 5 minutes before end of period to avoid busy/noisy hallway.  Please provide any supplemental learning materials (power points, lecture notes, handouts, etc) in minimum size 18 font and allow/provide any auditory supplements to learning when possible (books on tape, audio tape lectures, etc) to limit visual stress in the classroom.  Patient is cleared for auditory participation only. Patient is not cleared for homework, quizzes, or tests at this time.   Testing:  May begin taking tests/quizzes on *** with no more than one test/quiz per day.   No significant classroom or standardized testing until ***.  Home/Extracurricular:  Lessen work/homework load to allow adequate cognitive rest. Work *** minutes with intervals of *** minute breaks (total *** hours).  Limit visual stimulants including: driving, watching television/movies, reading, using cell phone, etc. - to ensure relative visual cognitive rest. NOT cleared for  video or phone games. May participate *** minutes with intervals of *** minute breaks (total *** hours).   Participation in physical activity: Patient is cleared to return to physical activity participation without restrictions.  Patient is not cleared for formal physical activity (includes physical education class, sports practices, sports games, weight training, etc) at this time.   However, we recommend that patient has 20-30 minutes of light cardiovascular activity daily, with NO risk of head injury (example: walking), staying below level of symptoms.  Recommend gradual progression to *** vestibular exercise (30-45 min/day) with no risk of head trauma while staying below level of symptoms.  See your exercise treatment menu for details.   Begin your exercise prescription on ____________ (see separate exercise prescription form)  Cleared for physical activity that poses NO RISK of head trauma.   Gradual return to physical activity under the supervision of a physician and/or athletic trainer  Once asymptomatic for 24 hours, patient may start Stage *** of [facility]PSM's { :10024} Exercise Progression Protocol. This is to be monitored by patient's { :28383}    Patient may start Stage *** of [facility]PS[facility]'s { :10024} Exercise Progression Protocol. This is to be monitored by patient's { :28383}.   Patient is not cleared for full contact activities, activities with risk of head trauma or unsupervised physical activity while participating in [facility]PSM's Exercise Progression. Check for return of symptoms (using Concussion Education Form Symptom List) when participating in activity and 24   hours following. If symptoms return, patient to contact our office for further recommendations.    [facility] Medical Recommendations form has been given to patient allowing *** to progress patient back into full participation.  Active Treatment Strategies:  Fueling your brain is important for recovery. It is essential to stay well  hydrated, aiming for half of your body weight in fluid ounces per day (100 lbs = 50 oz). We also recommend eating breakfast to start your day and focus on a well-balanced diet containing lean protein, 'good' fats, and complex carbohydrates. See your nutrition / hydration handout for more details.   Quality sleep is vital in your concussion recovery. We encourage lots of sleep for the first 24-72 hours after injury but following this period it is important to regulate your sleep cycle. We encourage *** hours of quality sleep per night. See your sleep handout for more details and strategies to quality sleep.  IF NOT USING THE OPTIONS BELOW DELETE THEM  Treating your vestibular and visual dysfunction will decrease your recovery time and improve your symptoms. Begin your home vestibular exercise program as directed on your AVS.    Begin taking Amantadine medicine as directed.   Begin taking DHA supplement as directed.    Begin home exercise program for neck as directed.   Follow-up information:  Follow up appointment at [facility] in *** .   Call [facility] at [phone] at least 24 hours after completion of Stage 4 with status update.  Patient needs to arrive 30 minutes prior to appointment to complete the following tests: { :28378}.    Patient Education:  Reviewed with patient the risks (i.e, a repeat concussion, post-concussion syndrome, second-impact syndrome) of returning to play prior to complete resolution, and thoroughly reviewed the signs and symptoms of concussion.Reviewed need for complete resolution of all symptoms, with rest AND exertion, prior to return to play.  Reviewed red flags for urgent medical evaluation: worsening symptoms, nausea/vomiting, intractable headache, musculoskeletal changes, focal neurological deficits.  [facility]'s Concussion Care Plan, which clearly outlines the plans stated above, was given to patient.  I was  personally involved with the physical evaluation of and am in agreement with the assessment and treatment plan for this patient.  Greater than 50% of this encounter was spent in direct consultation with the patient in evaluation, counseling, and coordination of care. Duration of encounter: { :28531} minutes.  After Visit Summary printed out and provided to patient as appropriate.  This note is written by Zachary M Smith, in the presence of and acting as the scribe of Zachary M Smith, DO.  

## 2017-07-12 ENCOUNTER — Ambulatory Visit: Payer: Self-pay | Admitting: Family Medicine

## 2018-01-11 ENCOUNTER — Telehealth: Payer: Self-pay

## 2018-01-11 NOTE — Telephone Encounter (Signed)
Spoke with patient and provided him with medical records phone number.

## 2018-01-11 NOTE — Telephone Encounter (Signed)
Copied from CRM [id number]. Topic: Inquiry >> Jan 11, 2018 11:11 AM Windy KalataMichael, Taylor L, NT wrote: Reason for CRM: patient is calling and states he needs a letter confirming he had a concussion in Feb 2019. He seen Dr. Katrinka BlazingSmith. He would like this mailed to him.

## 2021-01-05 ENCOUNTER — Other Ambulatory Visit: Payer: Self-pay

## 2021-01-05 ENCOUNTER — Emergency Department (HOSPITAL_BASED_OUTPATIENT_CLINIC_OR_DEPARTMENT_OTHER): Payer: Self-pay

## 2021-01-05 ENCOUNTER — Emergency Department (HOSPITAL_BASED_OUTPATIENT_CLINIC_OR_DEPARTMENT_OTHER)
Admission: EM | Admit: 2021-01-05 | Discharge: 2021-01-05 | Disposition: A | Discharge status: Home / Self Care | Payer: Self-pay | Attending: Emergency Medicine | Admitting: Emergency Medicine

## 2021-01-05 ENCOUNTER — Encounter (HOSPITAL_BASED_OUTPATIENT_CLINIC_OR_DEPARTMENT_OTHER): Payer: Self-pay

## 2021-01-05 DIAGNOSIS — R1012 Left upper quadrant pain: Secondary | ICD-10-CM | POA: Insufficient documentation

## 2021-01-05 DIAGNOSIS — R110 Nausea: Secondary | ICD-10-CM | POA: Insufficient documentation

## 2021-01-05 DIAGNOSIS — J45909 Unspecified asthma, uncomplicated: Secondary | ICD-10-CM | POA: Insufficient documentation

## 2021-01-05 DIAGNOSIS — R1013 Epigastric pain: Secondary | ICD-10-CM | POA: Insufficient documentation

## 2021-01-05 DIAGNOSIS — Z87891 Personal history of nicotine dependence: Secondary | ICD-10-CM | POA: Insufficient documentation

## 2021-01-05 DIAGNOSIS — M546 Pain in thoracic spine: Secondary | ICD-10-CM | POA: Insufficient documentation

## 2021-01-05 DIAGNOSIS — Z7951 Long term (current) use of inhaled steroids: Secondary | ICD-10-CM | POA: Insufficient documentation

## 2021-01-05 LAB — COMPREHENSIVE METABOLIC PANEL
ALT: 20 U/L (ref 0–44)
AST: 17 U/L (ref 15–41)
Albumin: 4.3 g/dL (ref 3.5–5.0)
Alkaline Phosphatase: 50 U/L (ref 38–126)
Anion gap: 9 (ref 5–15)
BUN: 12 mg/dL (ref 6–20)
CO2: 23 mmol/L (ref 22–32)
Calcium: 9.2 mg/dL (ref 8.9–10.3)
Chloride: 105 mmol/L (ref 98–111)
Creatinine, Ser: 0.95 mg/dL (ref 0.61–1.24)
GFR, Estimated: >60 mL/min (ref >60)
Glucose, Bld: 88 mg/dL (ref 70–99)
Potassium: 3.5 mmol/L (ref 3.5–5.1)
Sodium: 137 mmol/L (ref 135–145)
Total Bilirubin: 0.6 mg/dL (ref 0.3–1.2)
Total Protein: 7.4 g/dL (ref 6.5–8.1)

## 2021-01-05 LAB — CBC
HCT: 39.3 % (ref 39.0–52.0)
Hemoglobin: 13.9 g/dL (ref 13.0–17.0)
MCH: 29.1 pg (ref 26.0–34.0)
MCHC: 35.4 g/dL (ref 30.0–36.0)
MCV: 82.2 fL (ref 80.0–100.0)
Platelets: 259 10*3/uL (ref 150–400)
RBC: 4.78 MIL/uL (ref 4.22–5.81)
RDW: 13 % (ref 11.5–15.5)
WBC: 13.9 10*3/uL — ABNORMAL HIGH (ref 4.0–10.5)
nRBC: 0 % (ref 0.0–0.2)

## 2021-01-05 LAB — URINALYSIS, ROUTINE W REFLEX MICROSCOPIC
Bilirubin Urine: NEGATIVE
Glucose, UA: NEGATIVE mg/dL
Hgb urine dipstick: NEGATIVE
Ketones, ur: NEGATIVE mg/dL
Leukocytes,Ua: NEGATIVE
Nitrite: NEGATIVE
Protein, ur: NEGATIVE mg/dL
Specific Gravity, Urine: 1.03 (ref 1.005–1.030)
pH: 6 (ref 5.0–8.0)

## 2021-01-05 LAB — LIPASE, BLOOD: Lipase: 33 U/L (ref 11–51)

## 2021-01-05 LAB — D-DIMER, QUANTITATIVE: D-Dimer, Quant: <0.27 ug/mL-FEU (ref 0.00–0.50)

## 2021-01-05 MED ORDER — LIDOCAINE VISCOUS HCL 2 % MT SOLN
15.0000 mL | Freq: Once | OROMUCOSAL | Status: AC
Start: 2021-01-05 — End: 2021-01-05
  Administered 2021-01-05: 15 mL via ORAL
  Filled 2021-01-05: qty 15

## 2021-01-05 MED ORDER — OMEPRAZOLE 20 MG PO CPDR: 20.0000 mg | DELAYED_RELEASE_CAPSULE | Freq: Every day | ORAL | 0 refills | Status: DC

## 2021-01-05 MED ORDER — ALUM & MAG HYDROXIDE-SIMETH 200-200-20 MG/5ML PO SUSP
30.0000 mL | Freq: Once | ORAL | Status: AC
  Administered 2021-01-05: 30 mL via ORAL
  Filled 2021-01-05: qty 30

## 2021-01-05 MED ORDER — LIDOCAINE 5 % EX PTCH
1.0000 | MEDICATED_PATCH | CUTANEOUS | Status: DC
  Administered 2021-01-05: 1 via TRANSDERMAL
  Filled 2021-01-05: qty 1

## 2021-01-05 NOTE — Discharge Instructions (Addendum)
Your workup was overall reassuring in the ED today without any acute findings on labs or images. Please pick up medication and take as prescribed to reduce the acid in your stomach as your pain may be related to a peptic ulcer.   I would recommend 600 mg Ibuprofen every 6-8 hours as needed for your back pain. You can also buy OTC Salon Pas patches to apply to the area to see if this does not help.  Follow up with your PCP regarding ED visit today. You can follow up with [facility] for primary care needs.   Return to the ED for any new/worsening symptoms

## 2021-01-05 NOTE — ED Provider Notes (Signed)
[facility] EMERGENCY DEPARTMENT Provider Note   CSN: [id number] Arrival date & time: 01/05/21  1945     History Chief Complaint  Patient presents with   Abdominal Pain    Roy Ferguson is a 30 y.o. male who presents to the ED today with complaint of gradual onset, intermittent, LUQ/epigastric pain for "months." Pt at first stated it was only present for 3 weeks however he admits it has gotten "worse" over the past 3 weeks but has been present likely since sometime at the beginning of this year. Pt is poor historian and not able to quantify very well during exam. He admits that he has felt nauseated as well and the pain is mostly worse after eating. Pt states that over the past 3 weeks he has has pain to his left flank area that is worse with movement and deep inspiration. Pt told triage he felt SOB however he denies this to me today. He admits to smoking marijuana for the relief. He states he cannot take Tylenol or Ibuprofen as he "took too much at one time and now it doesn't work." Per chart review pt was seen in 2013 for tylenol overdose. Pt denies fevers or chills. He is unsure if he has had urinary symptoms. Denies chest pain. No hx DVT/PE.   The history is provided by the patient and medical records.      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 No pertinent surgical history.     No family history on file.  Social History   Tobacco Use   Smoking status: Former    Packs/day: 2.00    Years: 1.50    Pack years: 3.00    Types: Cigarettes    Quit date: 05/15/2014    Years since quitting: 6.6   Smokeless tobacco: Never  Substance Use Topics   Alcohol use: Not Currently   Drug use: Yes     Types: Marijuana    Home Medications Prior to Admission medications   Medication Sig Start Date End Date Taking? Authorizing Provider  omeprazole (PRILOSEC) 20 MG capsule Take 1 capsule (20 mg total) by mouth daily. 01/05/21 02/04/21 Yes Fumiye Lubben, PA-C  albuterol (PROVENTIL HFA;VENTOLIN HFA) 108 (90 Base) MCG/ACT inhaler Inhale 1-2 puffs into the lungs every 6 (six) hours as needed for wheezing or shortness of breath.    [provider]  budesonide-formoterol (SYMBICORT) 80-4.5 MCG/ACT inhaler Inhale 2 puffs into the lungs 2 (two) times daily. 09/28/16   Nyoka Cowden, MD  budesonide-formoterol (SYMBICORT) 80-4.5 MCG/ACT inhaler Inhale 2 puffs into the lungs 2 (two) times daily. 09/28/16   Nyoka Cowden, MD  cetirizine (ZYRTEC) 10 MG tablet Take 10 mg by mouth daily.    [provider]  Fluticasone Propionate HFA (FLOVENT HFA IN) Inhale 1 puff into the lungs daily.    [provider]  HYDROcodone-acetaminophen (NORCO/VICODIN) 5-325 MG tablet Take 1 tablet by mouth every 6 (six) hours as needed. 01/24/17   Horton, Mayer Masker, MD  naproxen (NAPROSYN) 500 MG tablet Take 1 tablet (500 mg total) by mouth 2 (two) times daily. 06/15/17   Anselm Pancoast, PA-C  ondansetron (ZOFRAN ODT) 4 MG disintegrating tablet Take 1 tablet (4 mg total) by mouth every 8 (eight) hours as needed for nausea or vomiting. 06/15/17   Joy, Shawn C, PA-C    Allergies    Apple, Cat hair extract, Codeine, Dog epithelium, Other, and Sulfa drugs cross reactors  Review of Systems   Review of Systems  Constitutional:  Negative for chills and fever.  Respiratory:  Negative for cough and shortness of breath.   Gastrointestinal:  Positive for abdominal pain and nausea. Negative for constipation, diarrhea and vomiting.  Genitourinary:  Positive for flank pain.  All other systems reviewed and are negative.  Physical Exam Updated Vital Signs BP 121/71 (BP Location: Right Arm)   Pulse (!) 59   Temp  98.6 F (37 C) (Oral)   Resp 18   Ht 5\' 10"  (1.778 m)   Wt 97.5 kg   SpO2 100%   BMI 30.85 kg/m   Physical Exam Vitals and nursing note reviewed.  Constitutional:      Appearance: He is not ill-appearing or diaphoretic.  HENT:     Head: Normocephalic and atraumatic.  Eyes:     Conjunctiva/sclera: Conjunctivae normal.  Cardiovascular:     Rate and Rhythm: Normal rate and regular rhythm.     Heart sounds: Normal heart sounds.  Pulmonary:     Effort: Pulmonary effort is normal.     Breath sounds: Normal breath sounds. No wheezing, rhonchi or rales.  Chest:     Chest wall: No tenderness.  Abdominal:     General: Bowel sounds are normal.     Palpations: Abdomen is soft.     Tenderness: There is no abdominal tenderness. There is left CVA tenderness. There is no right CVA tenderness, guarding or rebound.  Musculoskeletal:     Cervical back: Neck supple.  Skin:    General: Skin is warm and dry.  Neurological:     Mental Status: He is alert.    ED Results / Procedures / Treatments   Labs (all labs ordered are listed, but only abnormal results are displayed) Labs Reviewed  CBC - Abnormal; Notable for the following components:      Result Value   WBC 13.9 (*)    All other components within normal limits  LIPASE, BLOOD  COMPREHENSIVE METABOLIC PANEL  URINALYSIS, ROUTINE W REFLEX MICROSCOPIC  D-DIMER, QUANTITATIVE    EKG EKG Interpretation  Date/Time:  Wednesday January 05 2021 21:54:47 EDT Ventricular Rate:  69 PR Interval:  135 QRS Duration: 94 QT Interval:  372 QTC Calculation: 399 R Axis:   50 Text Interpretation: Sinus rhythm Normal ECG No previous tracing Confirmed by Gwyneth SproutPlunkett, Whitney ([id number]) on 01/05/2021 10:36:57 PM  Radiology DG Ribs Unilateral W/Chest Left  Result Date: 01/05/2021 CLINICAL DATA:  Left rib pain.  Pain for weeks. EXAM: LEFT RIBS AND CHEST - 3+ VIEW COMPARISON:  Chest radiograph 09/28/2016 FINDINGS: No fracture or other bone lesions are seen  involving the ribs. There is no evidence of pneumothorax or pleural effusion. Both lungs are clear. Heart size and mediastinal contours are within normal limits. Air-filled colon in the upper abdomen. IMPRESSION: Negative radiographs of the chest and left ribs. Electronically Signed   By: Narda RutherfordMelanie  Sanford M.D.   On: 01/05/2021 22:27    Procedures Procedures   Medications Ordered in ED Medications  lidocaine (LIDODERM) 5 % 1 patch (1 patch Transdermal Patch Applied 01/05/21 2136)  alum & mag hydroxide-simeth (MAALOX/MYLANTA) 200-200-20 MG/5ML suspension 30 mL (30 mLs  Oral Given 01/05/21 2135)    And  lidocaine (XYLOCAINE) 2 % viscous mouth solution 15 mL (15 mLs Oral Given 01/05/21 2135)    ED Course  I have reviewed the triage vital signs and the nursing notes.  Pertinent labs & imaging results that were available during my care of the patient were reviewed by me and considered in my medical decision making (see chart for details).    MDM Rules/Calculators/A&P                           30 year old male who presents to the ED today with complaint of months of left upper quadrant/epigastric abdominal pain that is worse after eating however began having new left flank pain about 3 weeks ago that is worse with movement and deep inspiration.  He has never been evaluated for same.  On arrival to the ED today vitals are stable.  Patient afebrile, nontachycardic nontachypneic and appears to be in no acute distress.  He is resting comfortably in the room, texting on his phone.  His abdomen is soft and nontender.  He does have some left flank tenderness palpation.  He denies any urinary symptoms.  Patient is however a poor historian and difficult to fully assess how long this pain has been going on.  Denies any history of DVT or PE or risk factors.  He is nontachycardic and nonhypoxic, I have lower suspicion for same however given he is having pain to the left back with worsened deep inspiration we will  plan for D-dimer.  We will also obtain abdominal screening labs at this time.  We will provide GI cocktail, question if patient is having peptic ulcer disease as his pain is worse after eating as well.  Plan to assess urinalysis for any hemoglobin to suggest kidney stone as well.  If work-up overall reassuring will plan to discharge home with PCP follow-up.   CBC with leukocytosis 13,900 however pt without any infectious symptoms at this time and given pain has been ongoing for several months I have lower suspicion for acute abdomen. No recent labs to compare.  CMP without electrolyte abnormalities Lipase 33 D dimer negative < 0.27 U/A without signs of infection   On reevaluation pt reports "some" improvement in pain. An xray was done as well to rule out any acute abnormality in the chest - negative.   Given symptoms have been ongoing for quite sometime will plan for PCP follow up. I do not feel pt needs CT imaging at this time given pain has been ongoing for several months. Will discharge home with PPI to see if this does not help his epigastric pain. Difficult to say if his flank/lower back pain and his abdominal pain are related given poor history from patient. Question muscular strain on top of PUD vs GERD. Pt to follow up with PCP for same. Info given for [facility] for primary care needs. Stable for discharge.   This note was prepared using Dragon voice recognition software and may include unintentional dictation errors due to the inherent limitations of voice recognition software.   Final Clinical Impression(s) / ED Diagnoses Final diagnoses:  Epigastric pain  Nausea  Acute left-sided thoracic back pain    Rx / DC Orders ED Discharge Orders          Ordered    omeprazole (PRILOSEC) 20 MG capsule  Daily        01/05/21 2253  Discharge Instructions      Your workup was overall reassuring in the ED today without any acute findings on labs or images.  Please pick up medication and take as prescribed to reduce the acid in your stomach as your pain may be related to a peptic ulcer.   I would recommend 600 mg Ibuprofen every 6-8 hours as needed for your back pain. You can also buy OTC Salon Pas patches to apply to the area to see if this does not help.  Follow up with your PCP regarding ED visit today. You can follow up with [facility] for primary care needs.   Return to the ED for any new/worsening symptoms       Tanda Rockers, Cordelia Poche 01/05/21 2255    Gwyneth Sprout, MD 01/05/21 [phone]

## 2021-01-05 NOTE — ED Triage Notes (Addendum)
Pt c/o left side abd/flank pain x "few weeks"-nausea/diarrhea-c/o SOB x today-pt states pain is worse with deep breathe and with movement-NAD-steady gait

## 2021-04-01 ENCOUNTER — Emergency Department (HOSPITAL_BASED_OUTPATIENT_CLINIC_OR_DEPARTMENT_OTHER): Payer: Self-pay

## 2021-04-01 ENCOUNTER — Emergency Department (HOSPITAL_BASED_OUTPATIENT_CLINIC_OR_DEPARTMENT_OTHER)
Admission: EM | Admit: 2021-04-01 | Discharge: 2021-04-01 | Disposition: A | Discharge status: Home / Self Care | Payer: Self-pay | Attending: Emergency Medicine | Admitting: Emergency Medicine

## 2021-04-01 ENCOUNTER — Encounter (HOSPITAL_BASED_OUTPATIENT_CLINIC_OR_DEPARTMENT_OTHER): Payer: Self-pay

## 2021-04-01 ENCOUNTER — Other Ambulatory Visit: Payer: Self-pay

## 2021-04-01 DIAGNOSIS — Z87891 Personal history of nicotine dependence: Secondary | ICD-10-CM | POA: Insufficient documentation

## 2021-04-01 DIAGNOSIS — R110 Nausea: Secondary | ICD-10-CM | POA: Insufficient documentation

## 2021-04-01 DIAGNOSIS — Z7951 Long term (current) use of inhaled steroids: Secondary | ICD-10-CM | POA: Insufficient documentation

## 2021-04-01 DIAGNOSIS — R0789 Other chest pain: Secondary | ICD-10-CM | POA: Insufficient documentation

## 2021-04-01 DIAGNOSIS — R42 Dizziness and giddiness: Secondary | ICD-10-CM | POA: Insufficient documentation

## 2021-04-01 DIAGNOSIS — J45909 Unspecified asthma, uncomplicated: Secondary | ICD-10-CM | POA: Insufficient documentation

## 2021-04-01 DIAGNOSIS — R0602 Shortness of breath: Secondary | ICD-10-CM | POA: Insufficient documentation

## 2021-04-01 DIAGNOSIS — R002 Palpitations: Secondary | ICD-10-CM | POA: Insufficient documentation

## 2021-04-01 LAB — CBC
HCT: 39.9 % (ref 39.0–52.0)
Hemoglobin: 14.2 g/dL (ref 13.0–17.0)
MCH: 28.9 pg (ref 26.0–34.0)
MCHC: 35.6 g/dL (ref 30.0–36.0)
MCV: 81.1 fL (ref 80.0–100.0)
Platelets: 354 10*3/uL (ref 150–400)
RBC: 4.92 MIL/uL (ref 4.22–5.81)
RDW: 13.3 % (ref 11.5–15.5)
WBC: 11.2 10*3/uL — ABNORMAL HIGH (ref 4.0–10.5)
nRBC: 0 % (ref 0.0–0.2)

## 2021-04-01 LAB — BASIC METABOLIC PANEL
Anion gap: 8 (ref 5–15)
BUN: 12 mg/dL (ref 6–20)
CO2: 23 mmol/L (ref 22–32)
Calcium: 9.5 mg/dL (ref 8.9–10.3)
Chloride: 108 mmol/L (ref 98–111)
Creatinine, Ser: 0.94 mg/dL (ref 0.61–1.24)
GFR, Estimated: >60 mL/min (ref >60)
Glucose, Bld: 106 mg/dL — ABNORMAL HIGH (ref 70–99)
Potassium: 3.5 mmol/L (ref 3.5–5.1)
Sodium: 139 mmol/L (ref 135–145)

## 2021-04-01 LAB — TROPONIN I (HIGH SENSITIVITY)
Troponin I (High Sensitivity): 4 ng/L (ref <18)
Troponin I (High Sensitivity): 4 ng/L (ref <18)

## 2021-04-01 LAB — D-DIMER, QUANTITATIVE: D-Dimer, Quant: <0.27 ug/mL-FEU (ref 0.00–0.50)

## 2021-04-01 NOTE — ED Triage Notes (Addendum)
Pt arrives ambulatory to ED with c/o CP , states he was seen a few months ago for the same and told he may have a stomach ulcer. Pt states that he has continued to have issues X2 weeks and a tightness in his chest and back with fatigue. 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 Pain was sharp, with left jaw pain, neck pain on left side, and ringing in ears. Pt states pain has gone down from arm to writs. Was SOB today with nausea and sweating.

## 2021-04-01 NOTE — ED Provider Notes (Signed)
[facility] EMERGENCY DEPARTMENT Provider Note   CSN: [id number] Arrival date & time: 04/01/21  1356     History Chief Complaint  Patient presents with   Chest Pain    Roy Ferguson is a 30 y.o. male.  With past medical history of anxiety and asthma who presents emergency department with chest pain.  Patient states that today he started having mid to left-sided chest pain that he describes as a pulling.  He states that this pain then progressed to being mid to left chest pain that he describes as sharp that radiated to his left jaw, left neck and under his left arm.  He states he had associated shortness of breath, palpitations, lightheadedness and dizziness, nausea without vomiting.  He denies syncope.  States that afterward he felt "exhausted."  He states that he did have anxiety during the episode but does not feel like a previous anxiety or panic attack.  He does note that he has had the same pulling chest pain daily however not the sharp pain.  He denies fevers, lower extremity swelling, tobacco use.  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 No pertinent surgical history.     No family history on file.  Social History   Tobacco Use   Smoking status: Former    Packs/day: 2.00    Years: 1.50    Pack years: 3.00    Types: Cigarettes    Quit date: 03/02/2021    Years since quitting: 0.0   Smokeless tobacco: Never  Vaping Use   Vaping Use: Never  used  Substance Use Topics   Alcohol use: Not Currently   Drug use: Yes    Types: Marijuana    Home Medications Prior to Admission medications   Medication Sig Start Date End Date Taking? Authorizing Provider  albuterol (PROVENTIL HFA;VENTOLIN HFA) 108 (90 Base) MCG/ACT inhaler Inhale 1-2 puffs into the lungs every 6 (six) hours as needed for wheezing or shortness of breath.    [provider]  budesonide-formoterol (SYMBICORT) 80-4.5 MCG/ACT inhaler Inhale 2 puffs into the lungs 2 (two) times daily. 09/28/16   Nyoka Cowden, MD  budesonide-formoterol (SYMBICORT) 80-4.5 MCG/ACT inhaler Inhale 2 puffs into the lungs 2 (two) times daily. 09/28/16   Nyoka Cowden, MD  cetirizine (ZYRTEC) 10 MG tablet Take 10 mg by mouth daily.    [provider]  Fluticasone Propionate HFA (FLOVENT HFA IN) Inhale 1 puff into the lungs daily.    [provider]  HYDROcodone-acetaminophen (NORCO/VICODIN) 5-325 MG tablet Take 1 tablet by mouth every 6 (six) hours as needed. 01/24/17   Horton, Mayer Masker, MD  naproxen (NAPROSYN) 500 MG tablet Take 1 tablet (500 mg total) by mouth 2 (two) times daily. 06/15/17   Joy, Shawn C, PA-C  omeprazole (PRILOSEC) 20 MG capsule Take 1 capsule (20 mg total) by mouth daily. 01/05/21 02/04/21  Hyman Hopes, Margaux, PA-C  ondansetron (ZOFRAN ODT) 4 MG disintegrating tablet Take 1 tablet (4 mg total) by mouth every 8 (eight) hours as needed for nausea or vomiting. 06/15/17   Joy, Shawn C, PA-C    Allergies    Apple, Cat hair extract, Codeine, Dog epithelium, Other, and Sulfa drugs cross reactors  Review of Systems   Review of Systems  Constitutional:  Negative for fever.  Respiratory:  Positive for shortness of breath. Negative for cough.   Cardiovascular:  Positive for chest pain and palpitations. Negative for leg swelling.  Gastrointestinal:  Positive for nausea. Negative for abdominal pain and vomiting.  Genitourinary:  Negative for dysuria.  Neurological:   Positive for dizziness and light-headedness. Negative for syncope.  Psychiatric/Behavioral:  The patient is nervous/anxious.   All other systems reviewed and are negative.  Physical Exam Updated Vital Signs BP (!) 142/92 (BP Location: Right Arm)   Pulse 84   Temp 98.5 F (36.9 C) (Oral)   Resp 18   Ht 5\' 10"  (1.778 m)   Wt 92.1 kg   SpO2 97%   BMI 29.13 kg/m   Physical Exam Vitals and nursing note reviewed.  Constitutional:      General: He is not in acute distress.    Appearance: Normal appearance. He is well-developed. He is not ill-appearing or toxic-appearing.  HENT:     Head: Normocephalic and atraumatic.  Eyes:     General: No scleral icterus.    Extraocular Movements: Extraocular movements intact.  Neck:     Vascular: No JVD.  Cardiovascular:     Rate and Rhythm: Normal rate and regular rhythm.     Pulses:          Radial pulses are 2+ on the right side and 2+ on the left side.       Dorsalis pedis pulses are 2+ on the right side and 2+ on the left side.     Heart sounds: Normal heart sounds. No murmur heard. Pulmonary:     Effort: Pulmonary effort is normal. No respiratory distress.     Breath sounds: Normal breath sounds.  Chest:     Chest wall: No tenderness.  Abdominal:     General: Bowel sounds are normal.     Palpations: Abdomen is soft.  Musculoskeletal:        General: Normal range of motion.     Cervical back: Normal range of motion and neck supple.     Right lower leg: No edema.     Left lower leg: No edema.  Skin:    General: Skin is warm and dry.     Capillary Refill: Capillary refill takes less than 2 seconds.  Neurological:     General: No focal deficit present.     Mental Status: He is alert and oriented to person, place, and time.  Psychiatric:        Mood and Affect: Mood normal.        Behavior: Behavior normal.    ED Results / Procedures / Treatments   Labs (all labs ordered are listed, but only abnormal results are  displayed) Labs Reviewed  BASIC METABOLIC PANEL - Abnormal; Notable for the following components:      Result Value   Glucose, Bld 106 (*)    All other components within normal limits  CBC - Abnormal; Notable for the following components:   WBC 11.2 (*)    All other components within normal limits  D-DIMER, QUANTITATIVE (NOT AT  [facility])  TROPONIN I (HIGH SENSITIVITY)  TROPONIN I (HIGH SENSITIVITY)   EKG None Sinus rhythm Radiology DG Chest 2 View  Result Date: 04/01/2021 CLINICAL DATA:  Chest pain. EXAM: CHEST - 2 VIEW COMPARISON:  January 05, 2021 FINDINGS: The heart size and mediastinal contours are within normal limits. Both lungs are clear. The visualized skeletal structures are unremarkable. IMPRESSION: No active cardiopulmonary disease. Electronically Signed   By: Aram Candela M.D.   On: 04/01/2021 15:00    Procedures Procedures   Medications Ordered in ED Medications - No data to display  ED Course  I have reviewed the triage vital signs and the nursing notes.  Pertinent labs & imaging results that were available during my care of the patient were reviewed by me and considered in my medical decision making (see chart for details).  HEAR Score: 2   MDM Rules/Calculators/A&P 30 year old male who presents emergency department with chest pain.  Exam without evidence of volume overload to doubt heart failure.  EKG without ischemia or infarction.  Troponin x2 negative, doubt ACS as component of chest pain.  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  Presentation is not consistent with acute PE. Chest x-ray without pneumothorax or pneumonia. Presentation is also not consistent with aortic dissection, pericarditis, myocarditis. WBC 11.2, likely reactive Heart score 2, x2 troponin negative so stable for discharge.  He is instructed to follow-up with his primary care provider.  I have also referred him to cardiology given  this is repeat presentation of chest pain.  Although I think some of symptoms may be anxiety related.  He is instructed to follow-up with cardiology, make an appointment for [facility] for primary care.  Instructed to return if worsening symptoms.  He understands discharge instruction with teach back. Final Clinical Impression(s) / ED Diagnoses Final diagnoses:  Atypical chest pain    Rx / DC Orders ED Discharge Orders     None        Cristopher Peru, PA-C 04/01/21 2328    Charlynne Pander, MD 04/02/21 1455

## 2021-04-01 NOTE — Discharge Instructions (Addendum)
Seen in the emergency department today for chest pain.  While you were here we did a thorough work-up which was all reassuring.  None of your labs were abnormal, your EKG was normal and your chest x-ray was normal.  All this is reassuring that there is nothing going on with your heart or lungs at this time.  Please return to emergency department if you begin to have similar or worsening symptoms, or fever combined with the symptoms.  Otherwise I would like you to follow-up with your primary care provider in 2 weeks to have ongoing management of your primary care since you do not have access to your other inhalers other than albuterol.

## 2021-04-01 NOTE — ED Notes (Signed)
ED Provider at bedside. 

## 2022-07-26 IMAGING — CR DG CHEST 2V
2 series · 2 of 2 positions shown · non-contrast
Comparison: January 05, 2021

CLINICAL DATA: Chest pain.

EXAM:
CHEST - 2 VIEW

[w chest pa]
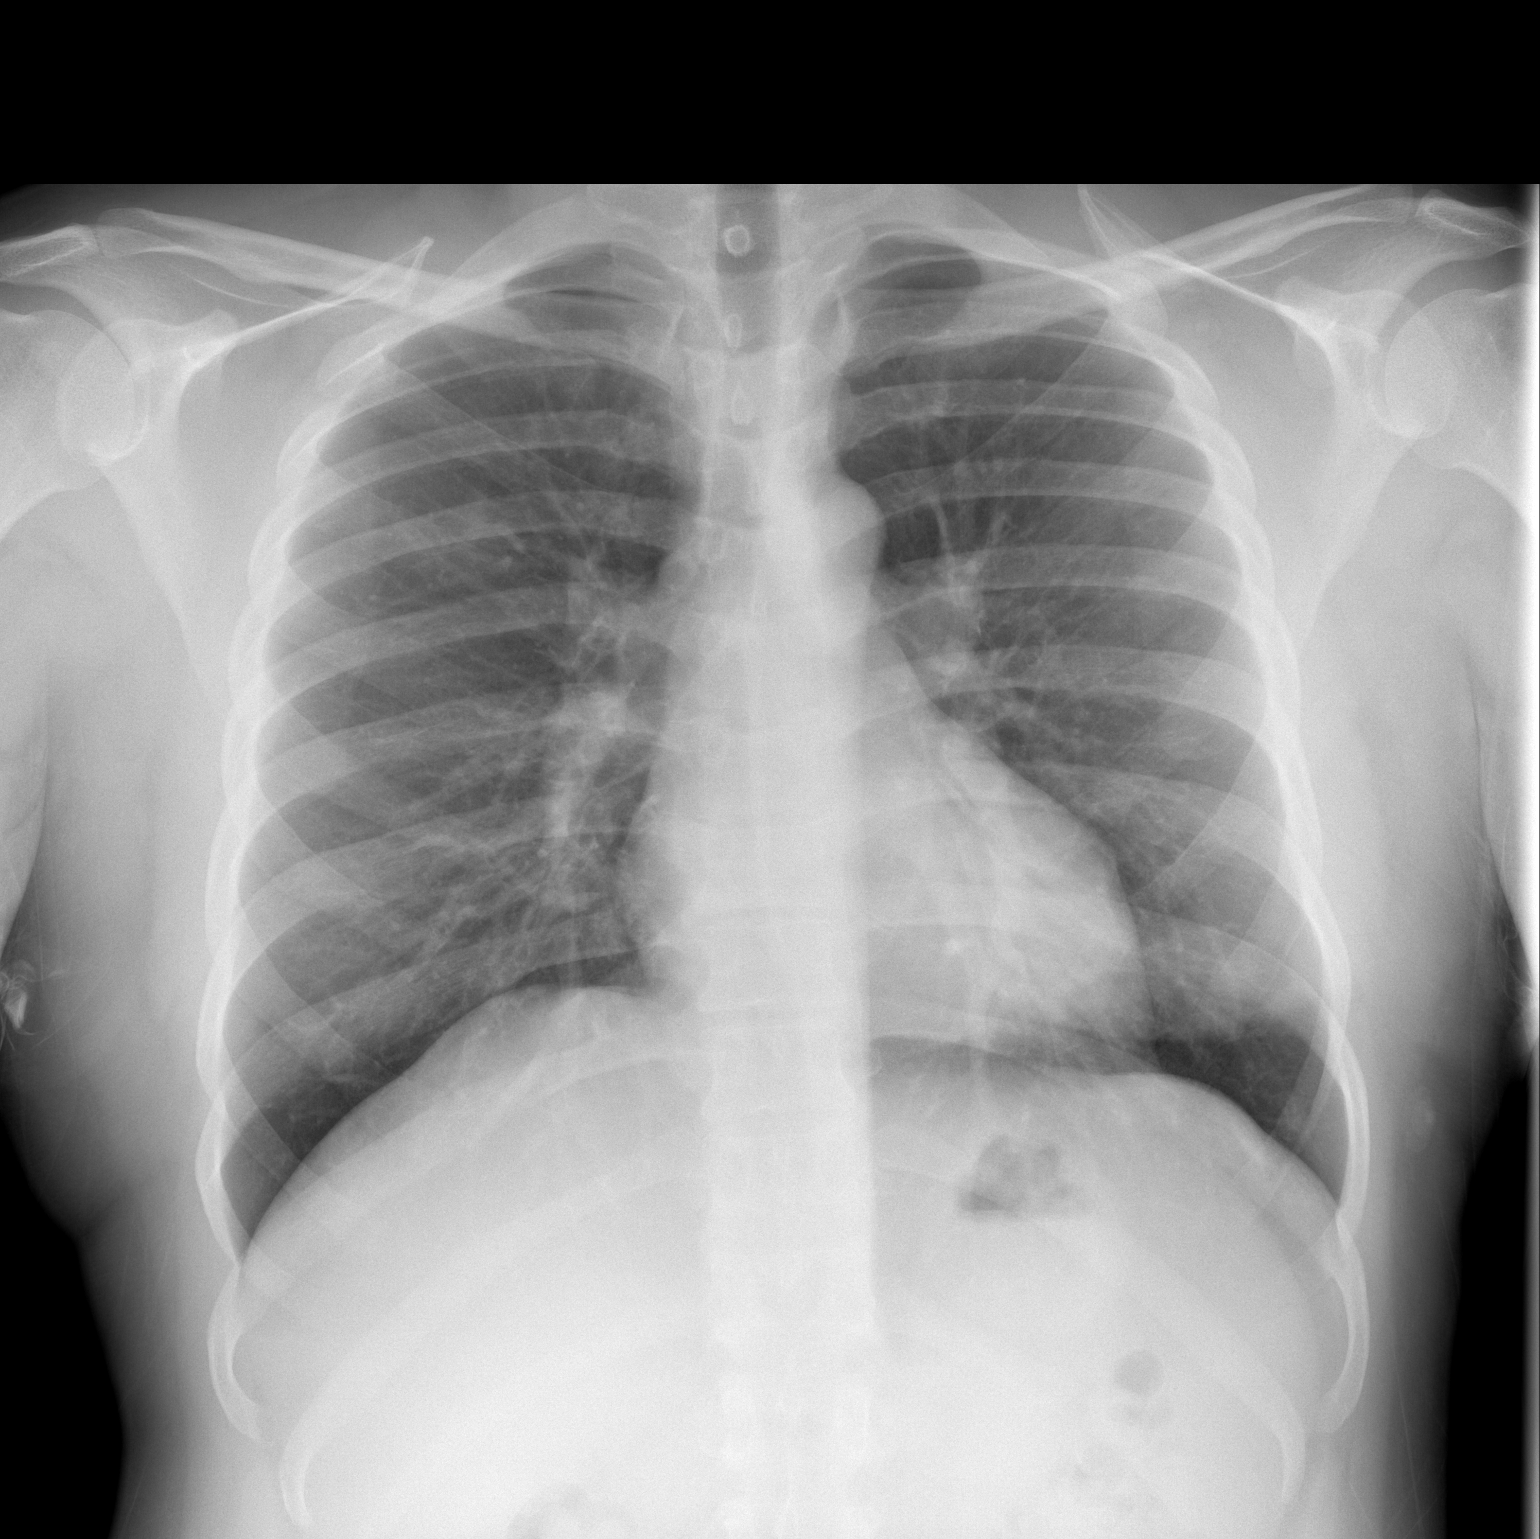

[w chest lat]
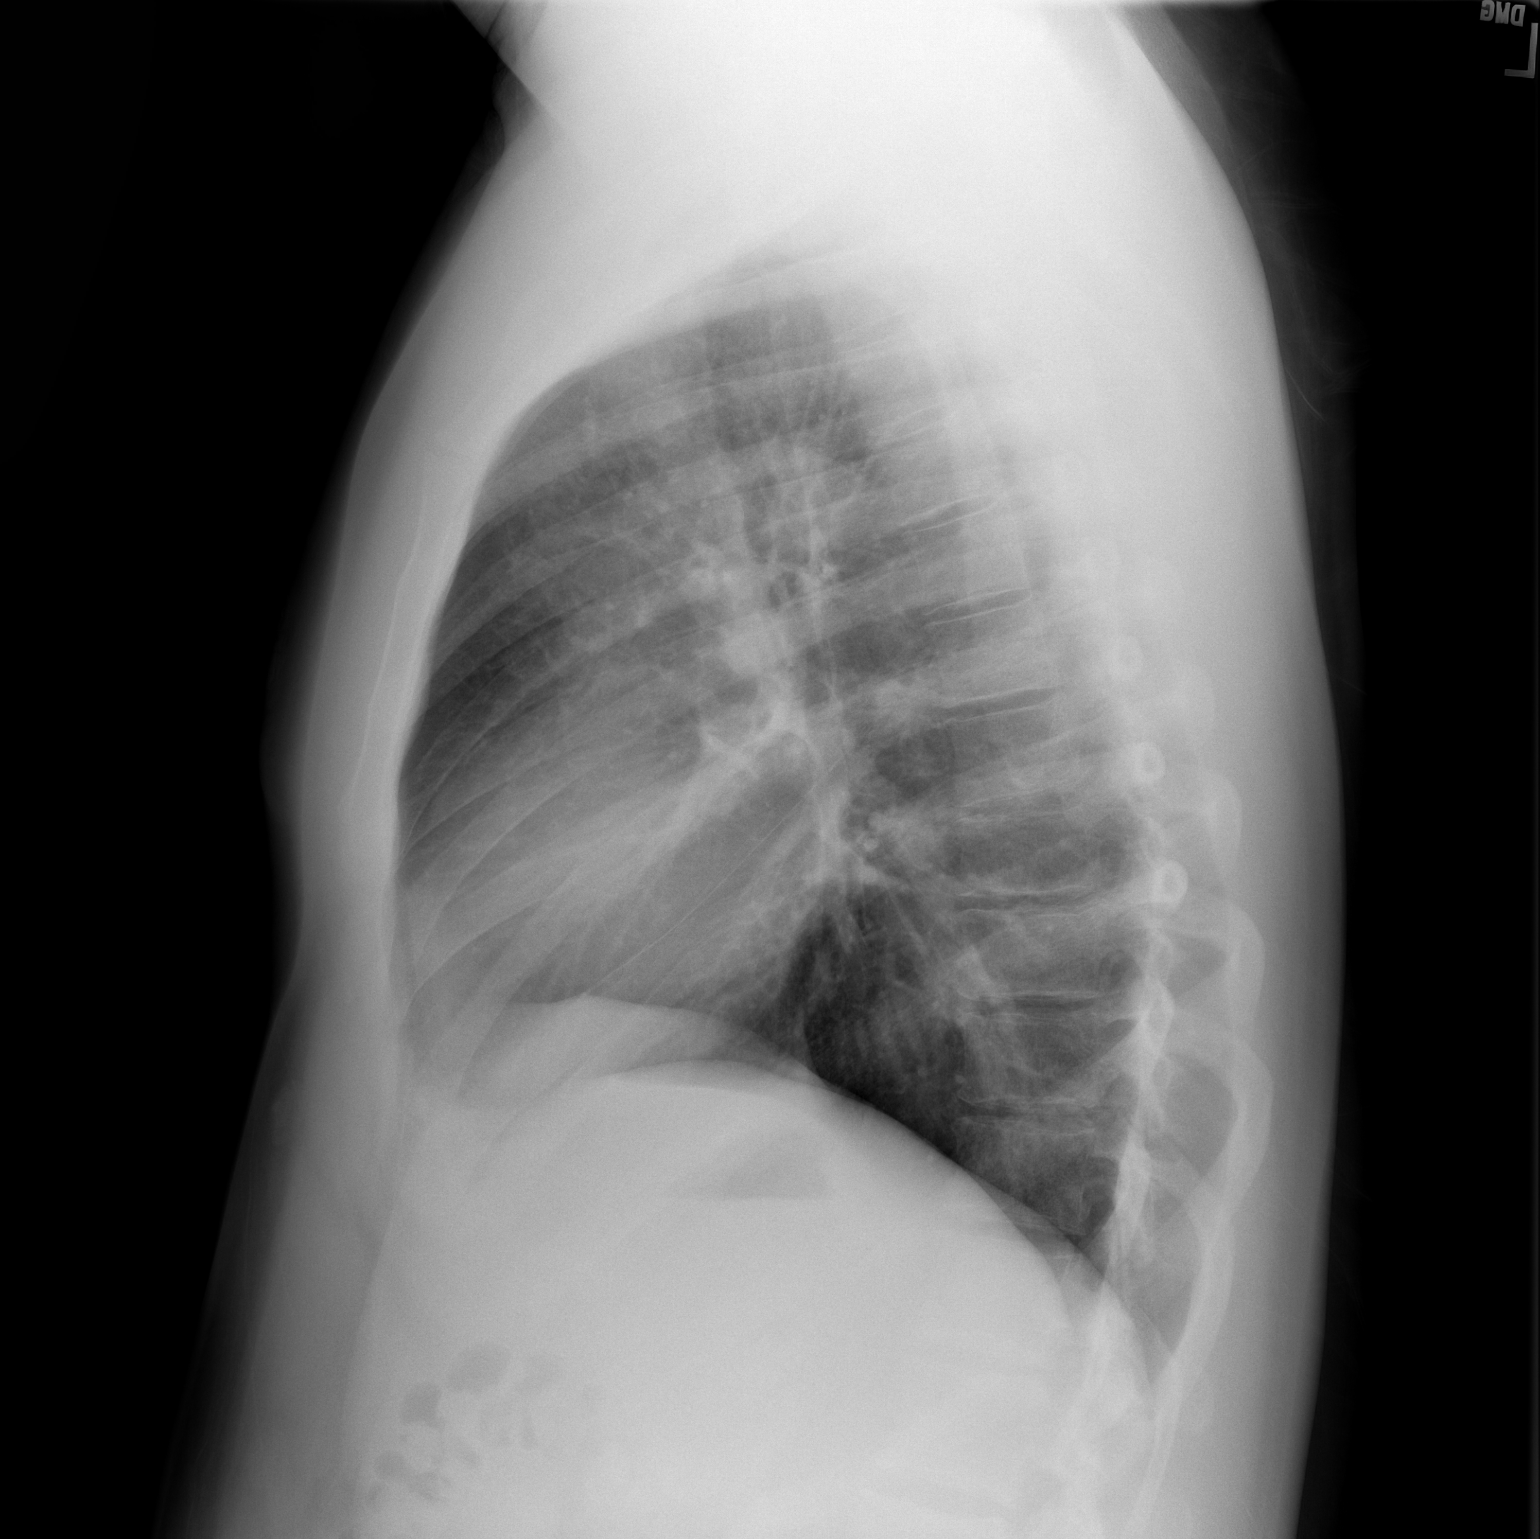

[2 of 2 positions shown; findings below may reference images not displayed]

FINDINGS: The heart size and mediastinal contours are within normal limits.
Both lungs are clear. The visualized skeletal structures are
unremarkable.
IMPRESSION: No active cardiopulmonary disease.

## 2023-02-03 ENCOUNTER — Emergency Department (HOSPITAL_BASED_OUTPATIENT_CLINIC_OR_DEPARTMENT_OTHER): Payer: Medicaid Other

## 2023-02-03 ENCOUNTER — Emergency Department (HOSPITAL_BASED_OUTPATIENT_CLINIC_OR_DEPARTMENT_OTHER)
Admission: EM | Admit: 2023-02-03 | Discharge: 2023-02-03 | Disposition: A | Discharge status: Home / Self Care | Payer: Self-pay | Attending: Emergency Medicine | Admitting: Emergency Medicine

## 2023-02-03 ENCOUNTER — Encounter (HOSPITAL_BASED_OUTPATIENT_CLINIC_OR_DEPARTMENT_OTHER): Payer: Self-pay | Admitting: Emergency Medicine

## 2023-02-03 DIAGNOSIS — S93492A Sprain of other ligament of left ankle, initial encounter: Secondary | ICD-10-CM | POA: Insufficient documentation

## 2023-02-03 DIAGNOSIS — S93402A Sprain of unspecified ligament of left ankle, initial encounter: Secondary | ICD-10-CM

## 2023-02-03 DIAGNOSIS — X58XXXA Exposure to other specified factors, initial encounter: Secondary | ICD-10-CM | POA: Insufficient documentation

## 2023-02-03 DIAGNOSIS — Y9301 Activity, walking, marching and hiking: Secondary | ICD-10-CM | POA: Insufficient documentation

## 2023-02-03 MED ORDER — OXYCODONE-ACETAMINOPHEN 5-325 MG PO TABS
1.0000 | ORAL_TABLET | ORAL | Status: DC | PRN
  Administered 2023-02-03: 1 via ORAL
  Filled 2023-02-03: qty 1

## 2023-02-03 MED ORDER — DROPERIDOL 2.5 MG/ML IJ SOLN: 1.2500 mg | Freq: Once | INTRAMUSCULAR | Status: DC

## 2023-02-03 NOTE — ED Provider Notes (Signed)
[place] [facility] AT [facility] Provider Note   CSN: [id number] Arrival date & time: 02/03/23  9562     History  Chief Complaint  Patient presents with   Foot Pain    Roy Ferguson is a 32 y.o. male denting for left ankle pain has been present for the past few years however got worse over the past few days as it is flared up.  He states that he does walk a lot for his job and the pain is around the front part of his ankle but that he is able to move his ankle slightly as this will exacerbate the pain.  Patient states that hurts to bear weight but can bear weight.  Patient denies fevers, skin color changes.  Patient denies trauma.  Patient has not taken any medications for this.  Home Medications Prior to Admission medications   Medication Sig Start Date End Date Taking? Authorizing Provider  albuterol (PROVENTIL HFA;VENTOLIN HFA) 108 (90 Base) MCG/ACT inhaler Inhale 1-2 puffs into the lungs every 6 (six) hours as needed for wheezing or shortness of breath.    [provider]  budesonide-formoterol (SYMBICORT) 80-4.5 MCG/ACT inhaler Inhale 2 puffs into the lungs 2 (two) times daily. 09/28/16   Nyoka Cowden, MD  budesonide-formoterol (SYMBICORT) 80-4.5 MCG/ACT inhaler Inhale 2 puffs into the lungs 2 (two) times daily. 09/28/16   Nyoka Cowden, MD  cetirizine (ZYRTEC) 10 MG tablet Take 10 mg by mouth daily.    [provider]  Fluticasone Propionate HFA (FLOVENT HFA IN) Inhale 1 puff into the lungs daily.    [provider]  HYDROcodone-acetaminophen (NORCO/VICODIN) 5-325 MG tablet Take 1 tablet by mouth every 6 (six) hours as needed. 01/24/17   Horton, Mayer Masker, MD  naproxen (NAPROSYN) 500 MG tablet Take 1 tablet (500 mg total) by mouth 2 (two) times daily. 06/15/17   Joy, Shawn C, PA-C  omeprazole (PRILOSEC) 20 MG capsule Take 1 capsule (20 mg total) by mouth daily. 01/05/21 02/04/21  Hyman Hopes, Margaux, PA-C  ondansetron (ZOFRAN ODT) 4 MG  disintegrating tablet Take 1 tablet (4 mg total) by mouth every 8 (eight) hours as needed for nausea or vomiting. 06/15/17   Joy, Shawn C, PA-C      Allergies    Apple juice, Cat hair extract, Codeine, Dog epithelium, Other, and Sulfa drugs cross reactors    Review of Systems   Review of Systems  Physical Exam Updated Vital Signs BP 134/89 (BP Location: Left Arm)   Pulse 87   Temp 98.1 F (36.7 C) (Oral)   Resp 18   Ht 5\' 10"  (1.778 m)   Wt 103 kg   SpO2 97%   BMI 32.57 kg/m  Physical Exam Constitutional:      General: He is not in acute distress. Musculoskeletal:     Comments: Left ankle: Does not appear edematous, mild tenderness to palpation on the ATF ligament suspicious of sprain, negative anterior drawer test, 5 out of 5 ankle plantarflexion/dorsiflexion, able to bear weight but this does exacerbate the pain, no bony tenderness or abnormalities palpated, soft compartments, pain not out of proportion No tenderness or abnormalities to the tib-fib on the left side  Skin:    General: Skin is warm and dry.     Capillary Refill: Capillary refill takes less than 2 seconds.     Coloration: Skin is not pale.     Findings: No erythema.  Neurological:     Mental Status: He is  alert.     Comments: Sensation intact distally and equal in bilateral feet  Psychiatric:        Mood and Affect: Mood normal.     ED Results / Procedures / Treatments   Labs (all labs ordered are listed, but only abnormal results are displayed) Labs Reviewed - No data to display  EKG None  Radiology DG Foot Complete Left  Result Date: 02/03/2023 CLINICAL DATA:  Left top of foot pain, atraumatic EXAM: LEFT FOOT - COMPLETE 3 VIEW COMPARISON:  None Available. FINDINGS: There is no evidence of fracture or dislocation. There is no evidence of arthropathy or other focal bone abnormality. Soft tissues are unremarkable. IMPRESSION: Negative. Electronically Signed   By: Tiburcio Pea M.D.   On: 02/03/2023  08:50    Procedures Procedures    Medications Ordered in ED Medications  oxyCODONE-acetaminophen (PERCOCET/ROXICET) 5-325 MG per tablet 1 tablet (1 tablet Oral Given 02/03/23 0810)    ED Course/ Medical Decision Making/ A&P                                 Medical Decision Making Amount and/or Complexity of Data Reviewed Radiology: ordered.  Risk Prescription drug management.   Roy Ferguson 32 y.o. presented today for left ankle pain. Working DDx that I considered at this time includes, but not limited to, contusion, strain/sprain, fracture, dislocation, neurovascular compromise, septic joint, ischemic limb, compartment syndrome.  R/o DDx: fracture, dislocation, neurovascular compromise, septic joint, ischemic limb, compartment syndrome, contusion: These are considered less likely due to history of present illness, physical exam, labs/imaging findings.  Review of prior external notes: 04/01/2021 ED  Unique Tests and My Interpretation:  Left ankle x-ray: Unremarkable  Discussion with Independent Historian:  Wife  Discussion of Management of Tests: None  Risk: Medium: prescription drug management  Risk Stratification Score: None  Plan: On exam patient was in no acute distress stable vitals.  Patient was tender on his ATF ligament suspicious of ankle sprain and had reassuring physical exam as he was neurovascular intact.  Patient does not take any medications for this and I suggested to the patient that he use Tylenol or ibuprofen every 6 hours and ice his ankle as well and elevate along with rest.  Encourage patient to use ankle rehab exercises well to prevent his ankle from walking up.  At patient's request we will give cam boot to help ambulate and primary care provider for long-term management.  Patient was given return precautions. Patient stable for discharge at this time.  Patient verbalized understanding of plan.         Final Clinical Impression(s) / ED  Diagnoses Final diagnoses:  Sprain of left ankle, unspecified ligament, initial encounter    Rx / DC Orders ED Discharge Orders     None         Netta Corrigan, PA-C 02/03/23 1003    Elayne Snare K, DO 02/03/23 1125

## 2023-02-03 NOTE — ED Triage Notes (Signed)
Left foot pain, progressively getting worse, states moved in his sleep and pain intensified. Denies injury.

## 2023-02-03 NOTE — ED Notes (Signed)
Discharge paperwork reviewed entirely with patient, including follow up care. Pain was under control. No prescriptions were called in, but all questions were addressed.  Pt verbalized understanding as well as all parties involved. No questions or concerns voiced at the time of discharge. No acute distress noted.   Pt ambulated out to PVA without incident or assistance.

## 2023-02-03 NOTE — ED Notes (Signed)
Told provider wrong, we apparently now have ASO lace braces. Fixed order set.

## 2023-02-03 NOTE — Discharge Instructions (Signed)
Please follow-up with the primary care provider attached your for you today regards to recent symptoms and ER visit.  Today your x-rays were negative and your exam is reassuring.  Most likely have an ankle sprain which may be treated with Tylenol ibuprofen every 6 hours along with ice.  You may ice your ankle 3-4 times a day for 15-20 minutes at a time.  You are given an ankle brace as well to help with ambulating.  Please practice the ankle rehab exercises I have attached here for you.  If symptoms change or worsen please return to ER.

## 2023-02-03 NOTE — ED Notes (Signed)
ASO removed MD changed orders to short cam boot

## 2023-03-27 ENCOUNTER — Ambulatory Visit (INDEPENDENT_AMBULATORY_CARE_PROVIDER_SITE_OTHER): Payer: Medicaid Other | Admitting: Family Medicine

## 2023-03-27 ENCOUNTER — Encounter: Payer: Self-pay | Admitting: Family Medicine

## 2023-03-27 ENCOUNTER — Other Ambulatory Visit (HOSPITAL_COMMUNITY)
Admission: RE | Admit: 2023-03-27 | Discharge: 2023-03-27 | Disposition: A | Discharge status: Home / Self Care | Payer: Medicaid Other | Source: Ambulatory Visit | Attending: Family Medicine | Admitting: Family Medicine

## 2023-03-27 VITALS — BP 128/74 | HR 93 | Temp 98.0°F | Resp 16 | Ht 70.0 in | Wt 239.4 lb

## 2023-03-27 DIAGNOSIS — Z0001 Encounter for general adult medical examination with abnormal findings: Secondary | ICD-10-CM | POA: Diagnosis not present

## 2023-03-27 DIAGNOSIS — R109 Unspecified abdominal pain: Secondary | ICD-10-CM

## 2023-03-27 DIAGNOSIS — R490 Dysphonia: Secondary | ICD-10-CM

## 2023-03-27 DIAGNOSIS — Z23 Encounter for immunization: Secondary | ICD-10-CM

## 2023-03-27 DIAGNOSIS — Z113 Encounter for screening for infections with a predominantly sexual mode of transmission: Secondary | ICD-10-CM | POA: Insufficient documentation

## 2023-03-27 DIAGNOSIS — M25572 Pain in left ankle and joints of left foot: Secondary | ICD-10-CM | POA: Diagnosis not present

## 2023-03-27 DIAGNOSIS — Z13220 Encounter for screening for lipoid disorders: Secondary | ICD-10-CM | POA: Diagnosis not present

## 2023-03-27 DIAGNOSIS — Z1159 Encounter for screening for other viral diseases: Secondary | ICD-10-CM

## 2023-03-27 DIAGNOSIS — Z0000 Encounter for general adult medical examination without abnormal findings: Secondary | ICD-10-CM

## 2023-03-27 DIAGNOSIS — G8929 Other chronic pain: Secondary | ICD-10-CM

## 2023-03-27 DIAGNOSIS — Z114 Encounter for screening for human immunodeficiency virus [HIV]: Secondary | ICD-10-CM

## 2023-03-27 MED ORDER — PANTOPRAZOLE SODIUM 40 MG PO TBEC: 40.0000 mg | DELAYED_RELEASE_TABLET | Freq: Every day | ORAL | 1 refills | Status: DC

## 2023-03-27 NOTE — Progress Notes (Signed)
Chief Complaint  Patient presents with   Establish Care    Establishing Care    Well Male Roy Ferguson is here for a complete physical.   His last physical was >1 year ago.  Current diet: in general, diet is fair.   Current exercise: walking Weight trend: stable Fatigue out of ordinary? Tired lately. Seat belt? Yes.   Advanced directive? No  Health maintenance Tetanus- No HIV- Yes Hep C- No  Patient has had upper abdominal pain worsened by eating over the past couple years.  He was recently prescribed omeprazole 20 mg daily which has not been helpful.  He denies any tarry/dark stools, unintentional weight loss, or nighttime awakenings.  No associated fevers.  Patient has been having off and on hoarseness over the past year.  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  He wonders if smoking marijuana precipitated this.  There is no pain or difficulty swallowing.  Patient has a several decade history of left ankle pain.  Most recently he was having pain over the inside and front of his ankle. No swelling, redness, bruising, catching/locking.   Past Medical History:  Diagnosis Date   Anxiety    Seasonal allergies      Past Surgical History:  Procedure Laterality Date   NO PAST SURGERIES      Medications  Current Outpatient Medications on File Prior to Visit  Medication Sig Dispense Refill   cetirizine (ZYRTEC) 10 MG tablet Take 10 mg by mouth daily.      Allergies Allergies  Allergen Reactions   Apple Juice     anaphylaxis   Cat Hair Extract    Codeine    Dog Epithelium    Other     Almonds   Sulfa Drugs Cross Reactors     Family History Family History  Problem Relation Age of Onset   Hypertrophic cardiomyopathy Brother    Cancer Neg Hx    Cancer - Cervical Neg Hx    Diabetes Neg Hx     Review of Systems: Constitutional: no fevers or chills Eye:  no recent significant change in vision Ear/Nose/Mouth/Throat:  Ears:  no hearing  loss Nose/Mouth/Throat:  no complaints of nasal congestion, no sore throat Cardiovascular:  no chest pain Respiratory:  no shortness of breath Gastrointestinal:  + abdominal pain, no change in bowel habits GU:  Male: negative for dysuria Musculoskeletal/Extremities:  +L ankle pain Integumentary (Skin/Breast):  no abnormal skin lesions reported Neurologic:  no headaches Endocrine: No unexpected weight changes Hematologic/Lymphatic:  no night sweats  Exam BP 128/74 (BP Location: Left Arm, Patient Position: Sitting, Cuff Size: Normal)   Pulse 93   Temp 98 F (36.7 C) (Oral)   Resp 16   Ht 5\' 10"  (1.778 m)   Wt 239 lb 6.4 oz (108.6 kg)   SpO2 98%   BMI 34.35 kg/m  General:  well developed, well nourished, in no apparent distress Skin:  no significant moles, warts, or growths Head:  no masses, lesions, or tenderness Eyes:  pupils equal and round, sclera anicteric without injection Ears:  canals without lesions, TMs shiny without retraction, no obvious effusion, no erythema Nose:  nares patent, mucosa normal Throat/Pharynx:  lips and gingiva without lesion; tongue and uvula midline; non-inflamed pharynx; no exudates or postnasal drainage Neck: neck supple without adenopathy, thyromegaly, or masses Lungs:  clear to auscultation, breath sounds equal bilaterally, no respiratory distress Cardio:  regular rate and rhythm, no bruits, no LE edema Abdomen:  abdomen soft, +mild ttp in epigastric region; bowel sounds normal; no masses or organomegaly Genital (male): Deferred Rectal: Deferred Musculoskeletal: Left ankle: +ttp over medial hindfoot, no TTP over the deltoid ligament or ATFL, negative anterior drawer and squeeze symmetrical muscle groups noted without atrophy or deformity Extremities:  no clubbing, cyanosis, or edema, no deformities, no skin discoloration Neuro:  gait normal; deep tendon reflexes normal and symmetric Psych: well oriented with normal range of affect and appropriate  judgment/insight  Assessment and Plan  Well adult exam - Plan: CBC, Comprehensive metabolic panel, Lipid panel  Encounter for hepatitis C screening test for low risk patient - Plan: Hepatitis C antibody  Screening for HIV without presence of risk factors - Plan: HIV Antibody (routine testing w rflx)  Screening examination for STI - Plan: Urine cytology ancillary only([place])  Chronic abdominal pain  Chronic pain of left ankle - Plan: Ambulatory referral to Sports Medicine  Hoarseness of voice - Plan: Ambulatory referral to ENT  Need for Tdap vaccination - Plan: Tdap vaccine greater than or equal to 7yo IM  Need for influenza vaccination - Plan: Flu vaccine trivalent PF, 6mos and older(Flulaval,Afluria,Fluarix,Fluzone)   Well 32 y.o. male. Counseled on diet and exercise. Self testicular exams recommended at least monthly.  Advanced directive form provided today.  Flu shot and Tdap today.  HIV and Hep C screen w STi screenings.  Other orders as above. Hoarseness of voice: Refer to ENT at his request Chronic abdominal pain: Chronic, uncontrolled.  Will change omeprazole 20 mg daily to Protonix 40 mg daily.  Reflux precautions written down.  Follow-up in 2 months to recheck this. Chronic ankle pain: Refer to sports medicine.  Stretches and exercises provided.  Heat, ice, Tylenol. The patient voiced understanding and agreement to the plan.  Jilda Roche [place], DO 03/27/23 2:19 PM

## 2023-03-27 NOTE — Patient Instructions (Addendum)
Give [place] 2-3 business days to get the results of your labs back.   Keep the diet clean and stay active.  Aim to do some physical exertion for 150 minutes per week. This is typically divided into 5 days per week, 30 minutes per day. The activity should be enough to get your heart rate up. Anything is better than nothing if you have time constraints.  Please get me a copy of your advanced directive form at your convenience.   Do monthly self testicular checks in the shower. You are feeling for lumps/bumps that don't belong. If you feel anything like this, let me know!  If you do not hear anything about your referral in the next 1-2 weeks, call our office and ask for an update.  Ice/cold pack over area for 10-15 min twice daily.  Heat (pad or rice pillow in microwave) over affected area, 10-15 minutes twice daily.   The only lifestyle changes that have data behind them are weight loss for the overweight/obese and elevating the head of the bed. Finding out which foods/positions are triggers is important.  Let us know if you need anything.

## 2023-03-28 ENCOUNTER — Other Ambulatory Visit: Payer: Self-pay

## 2023-03-28 DIAGNOSIS — E782 Mixed hyperlipidemia: Secondary | ICD-10-CM

## 2023-03-28 LAB — URINE CYTOLOGY ANCILLARY ONLY
Chlamydia: NEGATIVE
Comment: NEGATIVE
Comment: NEGATIVE
Comment: NORMAL
Neisseria Gonorrhea: NEGATIVE
Trichomonas: NEGATIVE

## 2023-03-28 LAB — COMPREHENSIVE METABOLIC PANEL
ALT: 44 U/L (ref 0–53)
AST: 23 U/L (ref 0–37)
Albumin: 4.6 g/dL (ref 3.5–5.2)
Alkaline Phosphatase: 58 U/L (ref 39–117)
BUN: 7 mg/dL (ref 6–23)
CO2: 25 meq/L (ref 19–32)
Calcium: 9.7 mg/dL (ref 8.4–10.5)
Chloride: 105 meq/L (ref 96–112)
Creatinine, Ser: 0.98 mg/dL (ref 0.40–1.50)
GFR: 102.84 mL/min (ref >60.00)
Glucose, Bld: 96 mg/dL (ref 70–99)
Potassium: 4.1 meq/L (ref 3.5–5.1)
Sodium: 139 meq/L (ref 135–145)
Total Bilirubin: 0.4 mg/dL (ref 0.2–1.2)
Total Protein: 7.6 g/dL (ref 6.0–8.3)

## 2023-03-28 LAB — LIPID PANEL
Cholesterol: 186 mg/dL (ref 0–200)
HDL: 35.7 mg/dL — ABNORMAL LOW (ref >39.00)
LDL Cholesterol: 109 mg/dL — ABNORMAL HIGH (ref 0–99)
NonHDL: 150.55
Total CHOL/HDL Ratio: 5
Triglycerides: 207 mg/dL — ABNORMAL HIGH (ref 0.0–149.0)
VLDL: 41.4 mg/dL — ABNORMAL HIGH (ref 0.0–40.0)

## 2023-03-28 LAB — CBC
HCT: 43.3 % (ref 39.0–52.0)
Hemoglobin: 14.8 g/dL (ref 13.0–17.0)
MCHC: 34.2 g/dL (ref 30.0–36.0)
MCV: 85 fL (ref 78.0–100.0)
Platelets: 289 10*3/uL (ref 150.0–400.0)
RBC: 5.1 Mil/uL (ref 4.22–5.81)
RDW: 13.9 % (ref 11.5–15.5)
WBC: 9.5 10*3/uL (ref 4.0–10.5)

## 2023-03-28 LAB — HEPATITIS C ANTIBODY: Hepatitis C Ab: NONREACTIVE

## 2023-03-28 LAB — HIV ANTIBODY (ROUTINE TESTING W REFLEX): HIV 1&2 Ab, 4th Generation: NONREACTIVE

## 2023-04-05 ENCOUNTER — Encounter (INDEPENDENT_AMBULATORY_CARE_PROVIDER_SITE_OTHER): Payer: Self-pay | Admitting: Otolaryngology

## 2023-04-26 ENCOUNTER — Telehealth (INDEPENDENT_AMBULATORY_CARE_PROVIDER_SITE_OTHER): Payer: Self-pay | Admitting: Otolaryngology

## 2023-04-26 NOTE — Telephone Encounter (Signed)
Confirmed appt & address w/ patient.

## 2023-04-27 ENCOUNTER — Institutional Professional Consult (permissible substitution) (INDEPENDENT_AMBULATORY_CARE_PROVIDER_SITE_OTHER): Payer: Self-pay

## 2023-05-28 ENCOUNTER — Ambulatory Visit: Payer: Medicaid Other | Admitting: Family Medicine

## 2023-06-01 ENCOUNTER — Institutional Professional Consult (permissible substitution) (INDEPENDENT_AMBULATORY_CARE_PROVIDER_SITE_OTHER): Payer: Self-pay | Admitting: Otolaryngology

## 2023-06-05 ENCOUNTER — Ambulatory Visit: Payer: Medicaid Other | Admitting: Family Medicine

## 2023-06-22 ENCOUNTER — Institutional Professional Consult (permissible substitution) (INDEPENDENT_AMBULATORY_CARE_PROVIDER_SITE_OTHER): Payer: Self-pay | Admitting: Otolaryngology

## 2023-08-10 ENCOUNTER — Ambulatory Visit (INDEPENDENT_AMBULATORY_CARE_PROVIDER_SITE_OTHER): Payer: Self-pay | Admitting: Otolaryngology

## 2023-08-10 ENCOUNTER — Encounter (INDEPENDENT_AMBULATORY_CARE_PROVIDER_SITE_OTHER): Payer: Self-pay | Admitting: Otolaryngology

## 2023-08-10 VITALS — BP 128/82 | HR 76

## 2023-08-10 DIAGNOSIS — R490 Dysphonia: Secondary | ICD-10-CM | POA: Diagnosis not present

## 2023-08-10 DIAGNOSIS — K219 Gastro-esophageal reflux disease without esophagitis: Secondary | ICD-10-CM | POA: Diagnosis not present

## 2023-08-10 DIAGNOSIS — J3489 Other specified disorders of nose and nasal sinuses: Secondary | ICD-10-CM

## 2023-08-10 DIAGNOSIS — J3089 Other allergic rhinitis: Secondary | ICD-10-CM

## 2023-08-10 DIAGNOSIS — H6990 Unspecified Eustachian tube disorder, unspecified ear: Secondary | ICD-10-CM | POA: Diagnosis not present

## 2023-08-10 DIAGNOSIS — R0982 Postnasal drip: Secondary | ICD-10-CM | POA: Diagnosis not present

## 2023-08-10 DIAGNOSIS — R0981 Nasal congestion: Secondary | ICD-10-CM

## 2023-08-10 DIAGNOSIS — J343 Hypertrophy of nasal turbinates: Secondary | ICD-10-CM

## 2023-08-10 DIAGNOSIS — R070 Pain in throat: Secondary | ICD-10-CM

## 2023-08-10 DIAGNOSIS — J342 Deviated nasal septum: Secondary | ICD-10-CM

## 2023-08-10 DIAGNOSIS — H6993 Unspecified Eustachian tube disorder, bilateral: Secondary | ICD-10-CM

## 2023-08-10 MED ORDER — CETIRIZINE HCL 10 MG PO TABS: 10.0000 mg | ORAL_TABLET | Freq: Every day | ORAL | 11 refills | Status: AC

## 2023-08-10 MED ORDER — FAMOTIDINE 20 MG PO TABS: 20.0000 mg | ORAL_TABLET | Freq: Two times a day (BID) | ORAL | 3 refills | Status: DC

## 2023-08-10 MED ORDER — FLUTICASONE PROPIONATE 50 MCG/ACT NA SUSP: 2.0000 | Freq: Two times a day (BID) | NASAL | 6 refills | Status: DC

## 2023-08-10 NOTE — Patient Instructions (Addendum)
 See information about Eustachian Tube Dysfunction below:    Overview The eustachian (say "you-STAY-shee-un") tubes connect the middle ear on each side to the back of the throat. They keep air pressure stable in the ears. If your eustachian tubes become blocked, the air pressure in your ears changes. A quick change in air pressure can cause eustachian tubes to close up. This might happen when an airplane changes altitude or when a scuba diver goes up or down underwater. And a cold can make the tubes swell and block the fluid in the middle ear from draining out. That can cause pain.  Eustachian tube problems often clear up on their own or after treating the cause of the blockage. If your tubes continue to be blocked, you may need surgery.  Follow-up care is a key part of your treatment and safety. Be sure to make and go to all appointments, and call your doctor or nurse advice line (811 in most provinces and territories) if you are having problems. It's also a good idea to know your test results and keep a list of the medicines you take.  How can you care for yourself at home? Try a simple exercise to help open blocked tubes. Close your mouth, hold your nose, and gently blow as if you are blowing your nose. Yawning and chewing gum also may help. You may hear or feel a "pop" when the tubes open. To ease ear pain, apply a warm face cloth or a heating pad set on low. There may be some drainage from the ear when the heat melts earwax. Put a cloth between the heat source and your skin. If your doctor prescribed antibiotics, take them as directed. Do not stop taking them just because you feel better. You need to take the full course of antibiotics. Be safe with medicines. Depending on the cause of the problem, your doctor may recommend over-the-counter medicine. For example, adults may try decongestants for cold symptoms or nasal spray steroids for allergies. Follow the instructions carefully.   TMJ  (Temporomandibular Joint Syndrome) The temporomandibular (tem-puh-roe-man-DIB-u-lur) joint (TMJ) acts like a sliding hinge, connecting your jawbone to your skull. You have one joint on each side of your jaw. TMJ disorders -- a type of temporomandibular disorder or TMD -- can cause pain in your jaw joint and in the muscles that control jaw movement.  The exact cause of a person's TMJ disorder is often difficult to determine. Your pain may be due to a combination of factors, such as genetics, arthritis or jaw injury. Some people who have jaw pain also tend to clench or grind their teeth (bruxism), although many people habitually clench or grind their teeth and never develop TMJ disorders.  In most cases, the pain and discomfort associated with TMJ disorders is temporary and can be relieved with self-managed care or nonsurgical treatments. This includes stress reduction, softer diet when the pain is present, anti-inflammatory pain medications such as Motrin and warm compresses.      Roy Ferguson Med Nasal Saline Rinse   - start nasal saline rinses with [employer]le available over the counter or online to help with nasal congestion     [email or web address][employer][email or web address] - check out this website to learn more about reflux   -Avoid lying down for at least two hours after a meal or after drinking acidic beverages, like soda, or other caffeinated beverages. This can help to prevent stomach contents from flowing back into the esophagus. -Keep your head elevated while you sleep.  Using an extra pillow or two can also help to prevent reflux. -Eat smaller and more frequent meals each day instead of a few large meals. This promotes digestion and can aid in preventing heartburn. -Wear loose-fitting clothes to ease pressure on the stomach, which can worsen heartburn and reflux. -Reduce excess weight around the midsection. This can ease pressure on the stomach. Such pressure can force some stomach contents back up the  esophagus  - Take Reflux Gourmet (natural supplement available on Amazon) to help with symptoms of chronic throat irritation

## 2023-08-10 NOTE — Progress Notes (Signed)
 ENT CONSULT:  Reason for Consult: chronic ear issues and hoarseness (sx for years)   HPI: Discussed the use of AI scribe software for clinical note transcription with the patient, who gave verbal consent to proceed.  History of Present Illness Roy Ferguson is a 33 year old male w/hx of environmental allergies, who presents with hoarseness and ear problems.  He has experienced hoarseness for the past two years, affecting his vocal control and increasing the effort required to sing. 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 He also experiences throat soreness, occasional hemoptysis, and expectoration of white chunks, suspected to be tonsil stones. During a recent illness, he described his cough as feeling like 'glass shards' in his throat.  He reports ear problems characterized by a sensation of fullness and popping, associated with brief dizzy spells. These symptoms have persisted for nearly ten years, with episodes of dizziness lasting from seconds to minutes. No frequent ear infections or ear pain requiring antibiotics or ear tube placement. He recalls using ear drops as a child but does not remember any recurrent ear infections in his adult like.  He has a history of severe allergies and nasal congestion, for which he takes Zyrtec daily. He has been tested for allergies and was found to be allergic to many things but has not received allergy shots. He also has a history of severe heartburn and reflux, for which he recently started taking a supplement, which he finds helpful. He drinks two to three bottles of water daily and uses a liquid IV for hydration.  He has a history of smoking tobacco and marijuana but has quit both 3 mo ago. He sings three to four days a week and is focused on maintaining vocal hygiene. No trouble swallowing or shortness of breath.  He has bruxism, and gets ear discomfort near TMJ joint frequently.  Records Reviewed:  Office visit  06/28/2017 with PCP Dr Katrinka Blazing Charisse Klinefelter, DOB: 06/09/1990, is a 33 y.o. male who presents for head injury sustained on 06/15/17. He tripped on a floor mat at a restaurant and hit the right side of his head. He did not lose consciousness. Patient did experiencing headache and fogginess. 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 Patient has not been back to school. Is starting a new job next week at [employer]. Patient states that his headaches have gone away and that the headache that he is having is from his sinuses.   Concussion testing performed today: Patient has had some difficulty with reaction time the patient is stating that he has been smoking cannabis recently.    Past Medical History:  Diagnosis Date   Anxiety    Seasonal allergies     Past Surgical History:  Procedure Laterality Date   NO PAST SURGERIES      Family History  Problem Relation Age of Onset   Hypertrophic cardiomyopathy Brother    Cancer Neg Hx    Cancer - Cervical Neg Hx    Diabetes Neg Hx     Social History:  reports that he quit smoking about 2 years ago. His smoking use included cigarettes. He started smoking about 3 years ago. He has a 3 pack-year smoking history. He has never used smokeless tobacco. He reports that he does not currently use alcohol. He reports that he does not currently use drugs after having used the following drugs: Marijuana.  Allergies:  Allergies  Allergen Reactions  Apple Juice     anaphylaxis   Cat Dander    Codeine    Dog Epithelium    Other     Almonds   Sulfa Drugs Cross Reactors     Medications: I have reviewed the patient's current medications.  The PMH, PSH, Medications, Allergies, and SH were reviewed and updated.  ROS: Constitutional: Negative for fever, weight loss and weight gain. Cardiovascular: Negative for chest pain and dyspnea on exertion. Respiratory: Is not experiencing shortness of breath at  rest. Gastrointestinal: Negative for nausea and vomiting. Neurological: Negative for headaches. Psychiatric: The patient is not nervous/anxious  Blood pressure 128/82, pulse 76, SpO2 93%. There is no height or weight on file to calculate BMI.  PHYSICAL EXAM:  Exam: General: Well-developed, well-nourished Communication and Voice: overall clear  Respiratory Respiratory effort: Equal inspiration and expiration without stridor Cardiovascular Peripheral Vascular: Warm extremities with equal color/perfusion Eyes: No nystagmus with equal extraocular motion bilaterally Neuro/Psych/Balance: Patient oriented to person, place, and time; Appropriate mood and affect; Gait is intact with no imbalance; Cranial nerves I-XII are intact Head and Face Inspection: Normocephalic and atraumatic without mass or lesion Palpation: Facial skeleton intact without bony stepoffs Salivary Glands: No mass or tenderness Facial Strength: Facial motility symmetric and full bilaterally ENT Pinna: External ear intact and fully developed External canal: Canal is patent with intact skin Tympanic Membrane: Clear and mobile External Nose: No scar or anatomic deformity Internal Nose: Septum is deviated to the left with narrowing of the nasal passages. No polyp, or purulence. Mucosal edema and erythema present.  Bilateral inferior turbinate hypertrophy.  Lips, Teeth, and gums: Mucosa and teeth intact and viable TMJ: (+) pain to palpation, mild subluxation left side with opening, full mobility Oral cavity/oropharynx: No erythema or exudate, no lesions present Nasopharynx: No mass or lesion with intact mucosa Hypopharynx: Intact mucosa without pooling of secretions Larynx Glottic: Full true vocal cord mobility without lesion or mass Supraglottic: Normal appearing epiglottis and AE folds Interarytenoid Space: Moderate pachydermia&edema Subglottic Space: Patent without lesion or edema Neck Neck and Trachea: Midline  trachea without mass or lesion Thyroid: No mass or nodularity Lymphatics: No lymphadenopathy  Procedure: Preoperative diagnosis: dysphonia   Postoperative diagnosis:   Same + GERD LPR  Procedure: Flexible fiberoptic laryngoscopy  Surgeon: Ashok Croon, MD  Anesthesia: Topical lidocaine and Afrin Complications: None Condition is stable throughout exam  Indications and consent:  The patient presents to the clinic with above symptoms. Indirect laryngoscopy view was incomplete. Thus it was recommended that they undergo a flexible fiberoptic laryngoscopy. All of the risks, benefits, and potential complications were reviewed with the patient preoperatively and verbal informed consent was obtained.  Procedure: The patient was seated upright in the clinic. Topical lidocaine and Afrin were applied to the nasal cavity. After adequate anesthesia had occurred, I then proceeded to pass the flexible telescope into the nasal cavity. The nasal cavity was patent without rhinorrhea or polyp. The nasopharynx was also patent without mass or lesion. The base of tongue was visualized and was normal. There were no signs of pooling of secretions in the piriform sinuses. The true vocal folds were mobile bilaterally. There were no signs of glottic or supraglottic mucosal lesion or mass. There was moderate interarytenoid pachydermia and post cricoid edema. The telescope was then slowly withdrawn and the patient tolerated the procedure throughout.    PROCEDURE NOTE: nasal endoscopy  Preoperative diagnosis: chronic nasal congestion symptoms  Postoperative diagnosis: same  Procedure: Diagnostic nasal endoscopy (16109)  Surgeon: Ashok Croon, M.D.  Anesthesia: Topical lidocaine and Afrin  H&P REVIEW: The patient's history and physical were reviewed today prior to procedure. All medications were reviewed and updated as well. Complications: None Condition is stable throughout exam Indications and consent:  The patient presents with symptoms of chronic sinusitis not responding to previous therapies. All the risks, benefits, and potential complications were reviewed with the patient preoperatively and informed consent was obtained. The time out was completed with confirmation of the correct procedure.   Procedure: The patient was seated upright in the clinic. Topical lidocaine and Afrin were applied to the nasal cavity. After adequate anesthesia had occurred, the rigid nasal endoscope was passed into the nasal cavity. The nasal mucosa, turbinates, septum, and sinus drainage pathways were visualized bilaterally. This revealed no purulence or significant secretions that might be cultured. There were no polyps or sites of significant inflammation. The mucosa was intact and there was no crusting present. The scope was then slowly withdrawn and the patient tolerated the procedure well. There were no complications or blood loss.   Studies Reviewed: CXR 04/01/21 FINDINGS: The heart size and mediastinal contours are within normal limits. Both lungs are clear. The visualized skeletal structures are unremarkable.   IMPRESSION: No active cardiopulmonary disease.  Assessment/Plan: No diagnosis found.  Assessment and Plan Assessment & Plan Eustachian tube dysfunction Chronic ear fullness and popping, likely due to nasal congestion and allergies. Normal ear exam, but tender at TMJ points with mild subluxation on the left, w/o trismus suggests TMJ sy could be causing ear discomfort. He also reports brief seconds of balance issues/dizziness when driving and when changing head position, lasting for seconds. Hx of concussion sy after head trauma, could be related to that.  - Continue Zyrtec daily 10 mg, Rx sent. - Start Flonase nasal spray twice daily. - Use saline nasal rinse regularly. - allergy testing and potential immunotherapy/referral to Allergy sent today.  Hoarseness/Dysphonia  Chronic hoarseness  affecting singing, range when singing. Hx of tonsil stones and GERD. Also has hx of smoking both tobacco and cannabis products, quit 3 months ago. Scope exam w/o masses or lesions, and normal VF mobility, but he did have evidence of GERD LPR and PND. We discussed that voice changes are likely multifactorial, likely due to chronic nasal congestion/PND reflux changes. - Prescribe medication for reflux - Pepcid 20 mg BID - Implement dietary changes to manage reflux. - Use seaweed-based supplement reflux gourmet after meals. - Continue Zyrtec 10 mg and Flonase 2 puffs b/l nares BID. - Maintain good hydration. - Avoid smoking tobacco, cigars, and marijuana. - Consider working with a Primary school teacher. - Research online resources for Higher education careers adviser. 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 - Consider dental guard. - Practice relaxation techniques.  Follow-up Plan to assess improvement in symptoms and treatment effectiveness. - Schedule follow-up appointment in a few months to evaluate progress.   RTC 3-4 mo after allergy testing  Thank you for allowing me to participate in the care of this patient. Please do not hesitate to contact me with any questions or concerns.   Ashok Croon, MD Otolaryngology [facility] Phone: [phone] Fax: [phone]    08/10/2023, 8:29 AM

## 2023-09-19 ENCOUNTER — Other Ambulatory Visit (HOSPITAL_BASED_OUTPATIENT_CLINIC_OR_DEPARTMENT_OTHER): Payer: Self-pay

## 2023-09-19 ENCOUNTER — Ambulatory Visit (HOSPITAL_BASED_OUTPATIENT_CLINIC_OR_DEPARTMENT_OTHER): Admitting: Student

## 2023-09-19 ENCOUNTER — Ambulatory Visit (HOSPITAL_BASED_OUTPATIENT_CLINIC_OR_DEPARTMENT_OTHER)

## 2023-09-19 ENCOUNTER — Encounter (HOSPITAL_BASED_OUTPATIENT_CLINIC_OR_DEPARTMENT_OTHER): Payer: Self-pay | Admitting: Student

## 2023-09-19 DIAGNOSIS — M25572 Pain in left ankle and joints of left foot: Secondary | ICD-10-CM

## 2023-09-19 MED ORDER — METHYLPREDNISOLONE 4 MG PO TBPK
ORAL_TABLET | ORAL | 0 refills | Status: DC
  Filled 2023-09-19: qty 21, 6d supply, fill #0

## 2023-09-19 NOTE — Progress Notes (Signed)
 Chief Complaint: Left ankle pain     History of Present Illness:    Roy Ferguson is a 33 y.o. male presents today for evaluation of chronic left ankle pain.  He states that this has been ongoing for a few years without any past injury.  Pain is most significant over the front of the left ankle and worsens with weightbearing and ankle range of motion.  He has tried immobilization with a walking boot without any significant relief as well as ice, elevation, and compression.  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   Surgical History:   None  PMH/PSH/Family History/Social History/Meds/Allergies:    Past Medical History:  Diagnosis Date   Anxiety    Seasonal allergies    Past Surgical History:  Procedure Laterality Date   NO PAST SURGERIES     Social History   Socioeconomic History   Marital status: Single    Spouse name: Not on file   Number of children: Not on file   Years of education: Not on file   Highest education level: Not on file  Occupational History   Not on file  Tobacco Use   Smoking status: Former    Current packs/day: 0.00    Average packs/day: 2.0 packs/day for 1.5 years (3.0 ttl pk-yrs)    Types: Cigarettes    Start date: 09/02/2019    Quit date: 03/02/2021    Years since quitting: 2.5   Smokeless tobacco: Never  Vaping Use   Vaping status: Never Used  Substance and Sexual Activity   Alcohol use: Not Currently   Drug use: Not Currently    Types: Marijuana   Sexual activity: Yes    Partners: Female  Other Topics Concern   Not on file  Social History Narrative   Not on file   Social Drivers of Health   Financial Resource Strain: Not on file  Food Insecurity: Not on file  Transportation Needs: Not on file  Physical Activity: Not on file  Stress: Not on file  Social Connections: Not on file   Family History  Problem Relation  Age of Onset   Hypertrophic cardiomyopathy Brother    Cancer Neg Hx    Cancer - Cervical Neg Hx    Diabetes Neg Hx    Allergies  Allergen Reactions   Apple Juice     anaphylaxis   Cat Dander    Codeine    Dog Epithelium    Other     Almonds   Sulfa Drugs Cross Reactors    Current Outpatient Medications  Medication Sig Dispense Refill   methylPREDNISolone (MEDROL DOSEPAK) 4 MG TBPK tablet Take per packet instructions 21 each 0   cetirizine  (ZYRTEC ) 10 MG tablet Take 1 tablet (10 mg total) by mouth daily. 30 tablet 11   famotidine  (PEPCID ) 20 MG tablet Take 1 tablet (20 mg total) by mouth 2 (two) times daily. 30 tablet 3   fluticasone  (FLONASE ) 50 MCG/ACT nasal spray Place 2 sprays into both nostrils 2 (two) times daily. 16 g 6   pantoprazole  (PROTONIX ) 40 MG tablet Take 1 tablet (40 mg total) by mouth daily. (Patient not taking: Reported on 08/10/2023) 30 tablet 1   No current facility-administered medications for this  visit.   No results found.  Review of Systems:   A ROS was performed including pertinent positives and negatives as documented in the HPI.  Physical Exam :   Constitutional: NAD and appears stated age Neurological: Alert and oriented Psych: Appropriate affect and cooperative There were no vitals taken for this visit.   Comprehensive Musculoskeletal Exam:    Tenderness with palpation over the extensors of the left foot and ankle as well as posterior tibial tendon distribution.  Positive pes planus deformity.  Active ankle ROM to 15 degrees of dorsiflexion and plantarflexion which does cause discomfort.  No Achilles tenderness.  Negative anterior drawer and Thompson test.  Dorsalis pedis 2+.  Imaging:   Xray (left ankle 3 views): Os trigonum present without evidence of acute bony abnormality.   I personally reviewed and interpreted the radiographs.   Assessment:   33 y.o. 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  This has been slowly worsening  and is now to the point where it is affecting his ADLs.  On exam today, pain is located mainly over the dorsum of the foot and anterior ankle, suggesting some possible extensor tendinitis.  He does have notable pes planus and some tenderness over the medial ankle so may be a component of posterior tibial tendon dysfunction as well.  I will plan to start him on a Medrol Dosepak today and refer to physical therapy for evaluation and treatment.  Discussed to follow-up within 4 to 6 weeks should symptoms continue to persist.  Plan :    - Start Medrol Dosepak and refer to physical therapy     I personally saw and evaluated the patient, and participated in the management and treatment plan.  Sharrell Deck, PA-C Orthopedics

## 2023-09-20 ENCOUNTER — Encounter (HOSPITAL_BASED_OUTPATIENT_CLINIC_OR_DEPARTMENT_OTHER): Payer: Self-pay

## 2023-10-03 ENCOUNTER — Other Ambulatory Visit: Payer: Self-pay

## 2023-10-03 ENCOUNTER — Encounter: Payer: Self-pay | Admitting: Physical Therapy

## 2023-10-03 ENCOUNTER — Ambulatory Visit: Attending: Student | Admitting: Physical Therapy

## 2023-10-03 DIAGNOSIS — M6281 Muscle weakness (generalized): Secondary | ICD-10-CM | POA: Diagnosis present

## 2023-10-03 DIAGNOSIS — M5459 Other low back pain: Secondary | ICD-10-CM | POA: Insufficient documentation

## 2023-10-03 DIAGNOSIS — R262 Difficulty in walking, not elsewhere classified: Secondary | ICD-10-CM | POA: Diagnosis present

## 2023-10-03 DIAGNOSIS — M25572 Pain in left ankle and joints of left foot: Secondary | ICD-10-CM | POA: Diagnosis present

## 2023-10-03 DIAGNOSIS — R29898 Other symptoms and signs involving the musculoskeletal system: Secondary | ICD-10-CM | POA: Diagnosis present

## 2023-10-03 NOTE — Therapy (Signed)
 OUTPATIENT PHYSICAL THERAPY LOWER EXTREMITY TREATMENT   Patient Name: Roy Ferguson MRN: [id number] DOB:August 28, 1990, 33 y.o., male Today's Date: 10/04/2023  END OF SESSION:  PT End of Session - 10/04/23 1013     Visit Number 2    Number of Visits 17    Date for PT Re-Evaluation 11/28/23    Authorization Type [employer]    Authorization Time Period 10/03/23 to 11/29/23    PT Start Time 1015    PT Stop Time 1100    PT Time Calculation (min) 45 min    Activity Tolerance Patient tolerated treatment well    Behavior During Therapy [facility] for tasks assessed/performed;Anxious              Past Medical History:  Diagnosis Date   Anxiety    Seasonal allergies    Past Surgical History:  Procedure Laterality Date   NO PAST SURGERIES     Patient Active Problem List   Diagnosis Date Noted   Head injury 06/28/2017   Cough variant asthma  vs UACS 09/28/2016   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   Anxiety 01/23/2012    Class: Chronic    PCP: Jobe Mulder DO   REFERRING PROVIDER: Amedeo Jupiter, PA-C  REFERRING DIAG:  Diagnosis  M25.572 (ICD-10-CM) - Pain in left ankle and joints of left foot    THERAPY DIAG:  Pain in left ankle and joints of left foot  Difficulty in walking, not elsewhere classified  Other low back pain  Muscle weakness (generalized)  Other symptoms and signs involving the musculoskeletal system  Rationale for Evaluation and Treatment: Rehabilitation  ONSET DATE: a couple of years ago   SUBJECTIVE:   SUBJECTIVE STATEMENT: I think the ankle pain is worse today, the little bit of stuff yesterday to see what was wrong with it might have aggravated it. I also have pain in the back. Its about a 2 or 3.     EVAL--[place], got up to about 2 miles but then ankle started feeling very tight. Felt like Achilles was getting very tight and  tender, very sore. Kept trying to walk and it kept getting worse, was trying to work up to jogging too but had to stop due to intense pain in achilles and the top of my ankle. Recently within the past year the pain has been worse on the top, had to go the ED a couple of times. The PA put me on steroids, ankle is feeling somewhat better after the steroids but at the same time its hard to walk on it, its difficult to walk straight. Having some numbness and burning on the top of my foot and into toes. When I move my ankle, it feels like the tendons on top pull and its a sharp tingling but no longer all the way up my leg. Back is still an issue, not hurting at the moment but its caused some issues with my legs before- saw some providers and no one seemed to know what was wrong with it. No one really gave a solid answer, feel like the back got misdiagnosed a lot- 2 separate things happen, sometimes my back between the shoulder blades and a little lower can lock up, did have some kidney stones (probably a lot) in the past too which can wrap around to the front. Occasionally I have some lower back  pain as well and the last time it happened it felt like something scraping against my tailbone and that was shooting down my whole leg that time.   PERTINENT HISTORY: See above  PAIN:  Are you having pain? No 0/10, at worst ankle can get up to 10/10  PRECAUTIONS: None  RED FLAGS: None   WEIGHT BEARING RESTRICTIONS: No  FALLS:  Has patient fallen in last 6 months? Yes. Number of falls multiple   LIVING ENVIRONMENT: Lives with: lives with their family Lives in: House/apartment Stairs: first floor apt Has following equipment at home: None  OCCUPATION: [employer]  PLOF: Independent, Independent with basic ADLs, Independent with gait, and Independent with transfers  PATIENT GOALS: be able to jog for exercise without pain, strengthen back     OBJECTIVE:  Note: Objective measures were completed at  Evaluation unless otherwise noted.  DIAGNOSTIC FINDINGS:   CLINICAL DATA:  Left ankle pain.   EXAM: LEFT ANKLE COMPLETE - 3+ VIEW   COMPARISON:  Left foot radiographs 02/03/2023   FINDINGS: The ankle mortise is symmetric and intact. Mild lateral malleolar soft tissue swelling. Joint spaces are preserved. No acute fracture is seen. No dislocation.   IMPRESSION: Mild lateral malleolar soft tissue swelling. No acute fracture.  PATIENT SURVEYS:  FAAM 35/80  COGNITION: Overall cognitive status: Within functional limits for tasks assessed     SENSATION: Top of foot going into toes       PALPATION: Very tender extensor tendons and talar joint line, joints in L foot and ankle, B malleoli VERY rigid and with very limited joint play   LOWER EXTREMITY MMT:  MMT Right eval Left eval  Hip flexion 4+ back pain  4+  Hip extension    Hip abduction    Hip adduction    Hip internal rotation    Hip external rotation    Knee flexion 4+ 4+  Knee extension 4+ 4+  Ankle dorsiflexion  4+  Ankle plantarflexion 4- 3+  Ankle inversion  4+  Ankle eversion  4   (Blank rows = not tested)  LOWER EXTREMITY ROM:  ROM Right eval Left eval  Hip flexion    Hip extension    Hip abduction    Hip adduction    Hip internal rotation    Hip external rotation    Knee flexion    Knee extension    Ankle dorsiflexion  2*  Ankle plantarflexion  60* top of foot pain   Ankle inversion  30* top of foot pain   Ankle eversion  18*   (Blank rows = not tested)  Lumbar flexion WNL RFIS some increased pain around R kidney area, lumbar extension mod limitation with radicular sx into tailbone and down LLE, lumbar lateral flexion mod limitation B increased tightness with more reps   GAIT: Distance walked: in clinic distances  Assistive device utilized: None Level of assistance: Complete Independence Comments: pes planus, some inversion noted with toe off, noted L ankle valgus  TREATMENT DATE:  10/04/23 Bike L3 x64mins  Calf stretch 15s x2  Calf raises 2x10 Step ups 6"  STS 2x10 HS stretch with strap 15s x2 both sides  Feet on pball rotations and knees to chest x10 Bridges 2x10 Ankle TB green 20 reps each way  HEP  10/03/23  Eval, POC     PATIENT EDUCATION:  Education details: exam findings, POC  Person educated: Patient Education method: Medical illustrator Education comprehension: verbalized understanding, returned demonstration, and needs further education  HOME EXERCISE PROGRAM: Access Code: [email or web address] URL: [email or web address] Date: 10/04/2023 Prepared by: Donavon Fudge  Exercises - Long Sitting Ankle Inversion with Resistance  - 1 x daily - 7 x weekly - 2 sets - 10 reps - Long Sitting Ankle Eversion with Resistance  - 1 x daily - 7 x weekly - 2 sets - 10 reps - Long Sitting Ankle Plantar Flexion with Resistance  - 1 x daily - 7 x weekly - 2 sets - 10 reps - Long Sitting Ankle Dorsiflexion with Anchored Resistance  - 1 x daily - 7 x weekly - 2 sets - 10 reps - Gastroc Stretch on Wall  - 1 x daily - 7 x weekly - 2 sets - 2 reps - 15 hold  ASSESSMENT:  CLINICAL IMPRESSION: Patient returns with pain in ankle and back. He reports with calf stretching and raises it felt like his ankle was going to lock up. Tried to ease him into starting some exercises to work on the ankle, LE, and back. Reports some stiffness in front of hips with bridges.    EVAL- Patient is a 33 y.o. M who was seen today for physical therapy evaluation and treatment for  Diagnosis  M25.572 (ICD-10-CM) - Pain in left ankle and joints of left foot  Objectives as above, he does have some back pain in multiple areas that may be affecting movement pattern and increasing ankle pain. Very pleasant but very talkative, which limited eval significantly today.  Will make every effort to work towards appropriate goals.   OBJECTIVE IMPAIRMENTS: Abnormal gait, decreased activity tolerance, decreased coordination, decreased mobility, difficulty walking, decreased ROM, decreased strength, hypomobility, increased edema, and pain.   ACTIVITY LIMITATIONS: standing, squatting, sleeping, stairs, transfers, and locomotion level  PARTICIPATION LIMITATIONS: driving, shopping, community activity, and occupation  PERSONAL FACTORS: Age, Behavior pattern, Education, Fitness, Social background, and Time since onset of injury/illness/exacerbation are also affecting patient's functional outcome.   REHAB POTENTIAL: Fair chronicity of pain, multiple chronic orthopedic complaints that may or may not be related  CLINICAL DECISION MAKING: Evolving/moderate complexity  EVALUATION COMPLEXITY: Moderate   GOALS: Goals reviewed with patient? No  SHORT TERM GOALS: Target date: 10/31/2023   Will be compliant with appropriate progressive HEP  Baseline: Goal status: INITIAL  2.  L ankle pain to be no more than 7/10 at worst  Baseline:  Goal status: INITIAL  3.  Lumbar AROM to be WNL without increased pain levels  Baseline:  Goal status: INITIAL  4.  Joint mobility in L ankle/foot to be no more than 50% limited  Baseline:  Goal status: INITIAL    LONG TERM GOALS: Target date: 11/28/2023    MMT to be 5/5 in all tested groups no pain  Baseline:  Goal status: INITIAL  2.  Will be able to ambulate unlimited distances with L ankle pain no more than 5/10 Baseline:  Goal status: INITIAL  3.  Will be compliant with appropriate gym based program that does not  flare ankle pain and allows him to work towards fitness goals  Baseline:  Goal status: INITIAL  4.  FAAM to improve by at least 10 points  Baseline:  Goal status: INITIAL  5.  L ankle DF AROM to be 15* without increase in pain  Baseline:  Goal status: INITIAL     PLAN:  PT FREQUENCY:  1-2x/week  PT DURATION: 8 weeks  PLANNED INTERVENTIONS: 97750- Physical Performance Testing, 97110-Therapeutic exercises, 97530- Therapeutic activity, [id number]- Neuromuscular re-education, 97535- Self Care, 47829- Manual therapy, 97016- Vasopneumatic device, and 97033- Ionotophoresis 4mg /ml Dexamethasone  PLAN FOR NEXT SESSION: manual as tolerated to improve ankle/foot joint mobility and DF ROM, gait training, encourage appropriate orthotics/foot support, lumbar interventions as appropriate. Consider ionto to ankle   Terrel Ferries, PT, DPT 10/04/23 11:00 AM

## 2023-10-03 NOTE — Therapy (Signed)
 OUTPATIENT PHYSICAL THERAPY LOWER EXTREMITY EVALUATION   Patient Name: Roy Ferguson MRN: [id number] DOB:April 24, 1991, 33 y.o., male Today's Date: 10/03/2023  END OF SESSION:  PT End of Session - 10/03/23 0804     Visit Number 1    Number of Visits 17    Date for PT Re-Evaluation 11/28/23    Authorization Type [employer]    Authorization Time Period 10/03/23 to 11/29/23    PT Start Time 0802    PT Stop Time 0843    PT Time Calculation (min) 41 min    Activity Tolerance Patient tolerated treatment well    Behavior During Therapy [facility] for tasks assessed/performed;Anxious             Past Medical History:  Diagnosis Date   Anxiety    Seasonal allergies    Past Surgical History:  Procedure Laterality Date   NO PAST SURGERIES     Patient Active Problem List   Diagnosis Date Noted   Head injury 06/28/2017   Cough variant asthma  vs UACS 09/28/2016   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   Anxiety 01/23/2012    Class: Chronic    PCP: Jobe Mulder DO   REFERRING PROVIDER: Amedeo Jupiter, PA-C  REFERRING DIAG:  Diagnosis  M25.572 (ICD-10-CM) - Pain in left ankle and joints of left foot    THERAPY DIAG:  Pain in left ankle and joints of left foot  Difficulty in walking, not elsewhere classified  Other low back pain  Muscle weakness (generalized)  Other symptoms and signs involving the musculoskeletal system  Rationale for Evaluation and Treatment: Rehabilitation  ONSET DATE: a couple of years ago   SUBJECTIVE:   SUBJECTIVE STATEMENT:  [place], got up to about 2 miles but then ankle started feeling very tight. Felt like Achilles was getting very tight and tender, very sore. Kept trying to walk and it kept getting worse, was trying to work up to jogging too but had to stop due to intense pain in achilles and the top of my ankle. Recently  within the past year the pain has been worse on the top, had to go the ED a couple of times. The PA put me on steroids, ankle is feeling somewhat better after the steroids but at the same time its hard to walk on it, its difficult to walk straight. Having some numbness and burning on the top of my foot and into toes. When I move my ankle, it feels like the tendons on top pull and its a sharp tingling but no longer all the way up my leg. Back is still an issue, not hurting at the moment but its caused some issues with my legs before- saw some providers and no one seemed to know what was wrong with it. No one really gave a solid answer, feel like the back got misdiagnosed a lot- 2 separate things happen, sometimes my back between the shoulder blades and a little lower can lock up, did have some kidney stones (probably a lot) in the past too which can wrap around to the front. Occasionally I have some lower back pain as well and the last time it happened it felt like something scraping against my tailbone and that was shooting down my whole leg that time.   PERTINENT HISTORY: See above  PAIN:  Are you having pain? No 0/10,  at worst ankle can get up to 10/10  PRECAUTIONS: None  RED FLAGS: None   WEIGHT BEARING RESTRICTIONS: No  FALLS:  Has patient fallen in last 6 months? Yes. Number of falls multiple   LIVING ENVIRONMENT: Lives with: lives with their family Lives in: House/apartment Stairs: first floor apt Has following equipment at home: None  OCCUPATION: [employer]  PLOF: Independent, Independent with basic ADLs, Independent with gait, and Independent with transfers  PATIENT GOALS: be able to jog for exercise without pain, strengthen back     OBJECTIVE:  Note: Objective measures were completed at Evaluation unless otherwise noted.  DIAGNOSTIC FINDINGS:   CLINICAL DATA:  Left ankle pain.   EXAM: LEFT ANKLE COMPLETE - 3+ VIEW   COMPARISON:  Left foot radiographs 02/03/2023    FINDINGS: The ankle mortise is symmetric and intact. Mild lateral malleolar soft tissue swelling. Joint spaces are preserved. No acute fracture is seen. No dislocation.   IMPRESSION: Mild lateral malleolar soft tissue swelling. No acute fracture.  PATIENT SURVEYS:  FAAM 35/80  COGNITION: Overall cognitive status: Within functional limits for tasks assessed     SENSATION: Top of foot going into toes       PALPATION: Very tender extensor tendons and talar joint line, joints in L foot and ankle, B malleoli VERY rigid and with very limited joint play   LOWER EXTREMITY MMT:  MMT Right eval Left eval  Hip flexion 4+ back pain  4+  Hip extension    Hip abduction    Hip adduction    Hip internal rotation    Hip external rotation    Knee flexion 4+ 4+  Knee extension 4+ 4+  Ankle dorsiflexion  4+  Ankle plantarflexion 4- 3+  Ankle inversion  4+  Ankle eversion  4   (Blank rows = not tested)  LOWER EXTREMITY ROM:  ROM Right eval Left eval  Hip flexion    Hip extension    Hip abduction    Hip adduction    Hip internal rotation    Hip external rotation    Knee flexion    Knee extension    Ankle dorsiflexion  2*  Ankle plantarflexion  60* top of foot pain   Ankle inversion  30* top of foot pain   Ankle eversion  18*   (Blank rows = not tested)  Lumbar flexion WNL RFIS some increased pain around R kidney area, lumbar extension mod limitation with radicular sx into tailbone and down LLE, lumbar lateral flexion mod limitation B increased tightness with more reps   GAIT: Distance walked: in clinic distances  Assistive device utilized: None Level of assistance: Complete Independence Comments: pes planus, some inversion noted with toe off, noted L ankle valgus                                                                                                                                 TREATMENT DATE:  10/03/23  Eval, POC     PATIENT EDUCATION:   Education details: exam findings, POC  Person educated: Patient Education method: Medical illustrator Education comprehension: verbalized understanding, returned demonstration, and needs further education  HOME EXERCISE PROGRAM:  2nd session  ASSESSMENT:  CLINICAL IMPRESSION: Patient is a 33 y.o. M who was seen today for physical therapy evaluation and treatment for  Diagnosis  M25.572 (ICD-10-CM) - Pain in left ankle and joints of left foot  . Objectives as above, he does have some back pain in multiple areas that may be affecting movement pattern and increasing ankle pain. Very pleasant but very talkative, which limited eval significantly today. Will make every effort to work towards appropriate goals.   OBJECTIVE IMPAIRMENTS: Abnormal gait, decreased activity tolerance, decreased coordination, decreased mobility, difficulty walking, decreased ROM, decreased strength, hypomobility, increased edema, and pain.   ACTIVITY LIMITATIONS: standing, squatting, sleeping, stairs, transfers, and locomotion level  PARTICIPATION LIMITATIONS: driving, shopping, community activity, and occupation  PERSONAL FACTORS: Age, Behavior pattern, Education, Fitness, Social background, and Time since onset of injury/illness/exacerbation are also affecting patient's functional outcome.   REHAB POTENTIAL: Fair chronicity of pain, multiple chronic orthopedic complaints that may or may not be related  CLINICAL DECISION MAKING: Evolving/moderate complexity  EVALUATION COMPLEXITY: Moderate   GOALS: Goals reviewed with patient? No  SHORT TERM GOALS: Target date: 10/31/2023   Will be compliant with appropriate progressive HEP  Baseline: Goal status: INITIAL  2.  L ankle pain to be no more than 7/10 at worst  Baseline:  Goal status: INITIAL  3.  Lumbar AROM to be WNL without increased pain levels  Baseline:  Goal status: INITIAL  4.  Joint mobility in L ankle/foot to be no more than 50%  limited  Baseline:  Goal status: INITIAL    LONG TERM GOALS: Target date: 11/28/2023    MMT to be 5/5 in all tested groups no pain  Baseline:  Goal status: INITIAL  2.  Will be able to ambulate unlimited distances with L ankle pain no more than 5/10 Baseline:  Goal status: INITIAL  3.  Will be compliant with appropriate gym based program that does not flare ankle pain and allows him to work towards fitness goals  Baseline:  Goal status: INITIAL  4.  FAAM to improve by at least 10 points  Baseline:  Goal status: INITIAL  5.  L ankle DF AROM to be 15* without increase in pain  Baseline:  Goal status: INITIAL     PLAN:  PT FREQUENCY: 1-2x/week  PT DURATION: 8 weeks  PLANNED INTERVENTIONS: 97750- Physical Performance Testing, 97110-Therapeutic exercises, 97530- Therapeutic activity, [id number]- Neuromuscular re-education, 97535- Self Care, [id number]- Manual therapy, 97016- Vasopneumatic device, and 97033- Ionotophoresis 4mg /ml Dexamethasone  PLAN FOR NEXT SESSION: manual as tolerated to improve ankle/foot joint mobility and DF ROM, gait training, encourage appropriate orthotics/foot support, lumbar interventions as appropriate. Consider ionto to ankle   Terrel Ferries, PT, DPT 10/03/23 9:40 AM

## 2023-10-04 ENCOUNTER — Ambulatory Visit

## 2023-10-04 DIAGNOSIS — R262 Difficulty in walking, not elsewhere classified: Secondary | ICD-10-CM

## 2023-10-04 DIAGNOSIS — M5459 Other low back pain: Secondary | ICD-10-CM

## 2023-10-04 DIAGNOSIS — R29898 Other symptoms and signs involving the musculoskeletal system: Secondary | ICD-10-CM

## 2023-10-04 DIAGNOSIS — M25572 Pain in left ankle and joints of left foot: Secondary | ICD-10-CM

## 2023-10-04 DIAGNOSIS — M6281 Muscle weakness (generalized): Secondary | ICD-10-CM

## 2023-10-16 ENCOUNTER — Other Ambulatory Visit (INDEPENDENT_AMBULATORY_CARE_PROVIDER_SITE_OTHER): Payer: Self-pay | Admitting: Otolaryngology

## 2023-10-16 ENCOUNTER — Ambulatory Visit: Attending: Student | Admitting: Physical Therapy

## 2023-10-16 DIAGNOSIS — R262 Difficulty in walking, not elsewhere classified: Secondary | ICD-10-CM | POA: Diagnosis present

## 2023-10-16 DIAGNOSIS — M6281 Muscle weakness (generalized): Secondary | ICD-10-CM | POA: Insufficient documentation

## 2023-10-16 DIAGNOSIS — R29898 Other symptoms and signs involving the musculoskeletal system: Secondary | ICD-10-CM | POA: Diagnosis present

## 2023-10-16 DIAGNOSIS — M25572 Pain in left ankle and joints of left foot: Secondary | ICD-10-CM | POA: Diagnosis present

## 2023-10-16 DIAGNOSIS — M5459 Other low back pain: Secondary | ICD-10-CM | POA: Insufficient documentation

## 2023-10-16 NOTE — Therapy (Signed)
 OUTPATIENT PHYSICAL THERAPY LOWER EXTREMITY TREATMENT   Patient Name: Roy Ferguson MRN: [id number] DOB:06-Feb-1991, 33 y.o., male Today's Date: 10/16/2023  END OF SESSION:  PT End of Session - 10/16/23 0933     Visit Number 3    Number of Visits 17    Date for PT Re-Evaluation 11/28/23    Authorization Type [employer]    Authorization Time Period 10/03/23 to 11/29/23    PT Start Time 0933    PT Stop Time 1015    PT Time Calculation (min) 42 min              Past Medical History:  Diagnosis Date   Anxiety    Seasonal allergies    Past Surgical History:  Procedure Laterality Date   NO PAST SURGERIES     Patient Active Problem List   Diagnosis Date Noted   Head injury 06/28/2017   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 Back is just tight     EVAL--[place], got up to about 2 miles but then ankle started feeling very tight. Felt like Achilles was getting very tight and tender, very sore. Kept trying to walk and it kept getting worse, was trying to work up to jogging too but had to stop due to intense pain in achilles and the top of my ankle. Recently within the past year the pain has been worse on the top, had to go the ED a couple of times. The PA put me on steroids, ankle is feeling  somewhat better after the steroids but at the same time its hard to walk on it, its difficult to walk straight. Having some numbness and burning on the top of my foot and into toes. When I move my ankle, it feels like the tendons on top pull and its a sharp tingling but no longer all the way up my leg. Back is still an issue, not hurting at the moment but its caused some issues with my legs before- saw some providers and no one seemed to know what was wrong with it. No one really gave a solid answer, feel like the back got misdiagnosed a lot- 2 separate things happen, sometimes my back between the shoulder blades and a little lower can lock up, did have some kidney stones (probably a lot) in the past too which can wrap around to the front. Occasionally I have some lower back pain as well and the last time it happened it felt like something scraping against my tailbone and that was shooting down my whole leg that time.   PERTINENT HISTORY: See above  PAIN:  Are you having pain? No 2/10 ankle . Back tight  PRECAUTIONS: None  RED FLAGS: None   WEIGHT BEARING  RESTRICTIONS: No  FALLS:  Has patient fallen in last 6 months? Yes. Number of falls multiple   LIVING ENVIRONMENT: Lives with: lives with their family Lives in: House/apartment Stairs: first floor apt Has following equipment at home: None  OCCUPATION: [employer]  PLOF: Independent, Independent with basic ADLs, Independent with gait, and Independent with transfers  PATIENT GOALS: be able to jog for exercise without pain, strengthen back     OBJECTIVE:  Note: Objective measures were completed at Evaluation unless otherwise noted.  DIAGNOSTIC FINDINGS:   CLINICAL DATA:  Left ankle pain.   EXAM: LEFT ANKLE COMPLETE - 3+ VIEW   COMPARISON:  Left foot radiographs 02/03/2023   FINDINGS: The ankle mortise is symmetric and intact. Mild lateral malleolar soft tissue swelling. Joint spaces are preserved. No acute fracture is seen. No  dislocation.   IMPRESSION: Mild lateral malleolar soft tissue swelling. No acute fracture.  PATIENT SURVEYS:  FAAM 35/80  COGNITION: Overall cognitive status: Within functional limits for tasks assessed     SENSATION: Top of foot going into toes       PALPATION: Very tender extensor tendons and talar joint line, joints in L foot and ankle, B malleoli VERY rigid and with very limited joint play   LOWER EXTREMITY MMT:  MMT Right eval Left eval  Hip flexion 4+ back pain  4+  Hip extension    Hip abduction    Hip adduction    Hip internal rotation    Hip external rotation    Knee flexion 4+ 4+  Knee extension 4+ 4+  Ankle dorsiflexion  4+  Ankle plantarflexion 4- 3+  Ankle inversion  4+  Ankle eversion  4   (Blank rows = not tested)  LOWER EXTREMITY ROM:  ROM Right eval Left eval  Hip flexion    Hip extension    Hip abduction    Hip adduction    Hip internal rotation    Hip external rotation    Knee flexion    Knee extension    Ankle dorsiflexion  2*  Ankle plantarflexion  60* top of foot pain   Ankle inversion  30* top of foot pain   Ankle eversion  18*   (Blank rows = not tested)  Lumbar flexion WNL RFIS some increased pain around R kidney area, lumbar extension mod limitation with radicular sx into tailbone and down LLE, lumbar lateral flexion mod limitation B increased tightness with more reps   GAIT: Distance walked: in clinic distances  Assistive device utilized: None Level of assistance: Complete Independence Comments: pes planus, some inversion noted with toe off, noted L ankle valgus                                                                                                                                 TREATMENT DATE:   10/16/23 Nustep L 4 Black bar heel raise/toe raises 15 x Leg Press 30# 2 sets  10,calf raises 2 sets 12 Feet on ball bridge,KTC and obl  SLR red tband 10x BIL then 10 x with SL into abd PROM and stretch to BIL  LE and trunk Jt mobs to left ankle to increase ROM and decrease pain ( as he c/o ant pinching) Ionto Left ant ankle 1.1 cc 4 hour leave on patch Issued seated HS stretch for home       10/04/23 Bike L3 x25mins  Calf stretch 15s x2  Calf raises 2x10 Step ups 6"  STS 2x10 HS stretch with strap 15s x2 both sides  Feet on pball rotations and knees to chest x10 Bridges 2x10 Ankle TB green 20 reps each way  HEP  10/03/23  Eval, POC     PATIENT EDUCATION:  Education details: exam findings, POC  Person educated: Patient Education method: Medical illustrator Education comprehension: verbalized understanding, returned demonstration, and needs further education  HOME EXERCISE PROGRAM: Access Code: [email or web address] URL: [email or web address] Date: 10/04/2023 Prepared by: Donavon Fudge  Exercises - Long Sitting Ankle Inversion with Resistance  - 1 x daily - 7 x weekly - 2 sets - 10 reps - Long Sitting Ankle Eversion with Resistance  - 1 x daily - 7 x weekly - 2 sets - 10 reps - Long Sitting Ankle Plantar Flexion with Resistance  - 1 x daily - 7 x weekly - 2 sets - 10 reps - Long Sitting Ankle Dorsiflexion with Anchored Resistance  - 1 x daily - 7 x weekly - 2 sets - 10 reps - Gastroc Stretch on Wall  - 1 x daily - 7 x weekly - 2 sets - 2 reps - 15 hold  ASSESSMENT:  CLINICAL IMPRESSION: Pt arrives feeling better than prior session, states doing HEP and c/o ant ankle pinching. Progressed ex,added MT for stretching ,ROM and relief pain. Added ionto for pain. Needs increased HEP to increase back. EVAL- Patient is a 33 y.o. M who was seen today for physical therapy evaluation and treatment for  Diagnosis  M25.572 (ICD-10-CM) - Pain in left ankle and joints of left foot  Objectives as above, he does have some back pain in multiple areas that may be affecting movement pattern and increasing ankle pain. Very pleasant but very talkative, which limited eval significantly  today. Will make every effort to work towards appropriate goals.   OBJECTIVE IMPAIRMENTS: Abnormal gait, decreased activity tolerance, decreased coordination, decreased mobility, difficulty walking, decreased ROM, decreased strength, hypomobility, increased edema, and pain.   ACTIVITY LIMITATIONS: standing, squatting, sleeping, stairs, transfers, and locomotion level  PARTICIPATION LIMITATIONS: driving, shopping, community activity, and occupation  PERSONAL FACTORS: Age, Behavior pattern, Education, Fitness, Social background, and Time since onset of injury/illness/exacerbation are also affecting patient's functional outcome.   REHAB POTENTIAL: Fair chronicity of pain, multiple chronic orthopedic complaints that may or may not be related  CLINICAL DECISION MAKING: Evolving/moderate complexity  EVALUATION COMPLEXITY: Moderate   GOALS: Goals reviewed with patient? No  SHORT TERM GOALS: Target date: 10/31/2023   Will be compliant with appropriate progressive HEP  Baseline: Goal status: 10/16/23 MET  2.  L ankle pain to be no more than 7/10 at worst  Baseline:  Goal status: 10/16/23 MET  3.  Lumbar AROM to be WNL without increased pain levels  Baseline:  Goal status: INITIAL  4.  Joint mobility in L ankle/foot to be no more than 50% limited  Baseline:  Goal status: INITIAL    LONG TERM GOALS: Target date: 11/28/2023  MMT to be 5/5 in all tested groups no pain  Baseline:  Goal status: INITIAL  2.  Will be able to ambulate unlimited distances with L ankle pain no more than 5/10 Baseline:  Goal status: INITIAL  3.  Will be compliant with appropriate gym based program that does not flare ankle pain and allows him to work towards fitness goals  Baseline:  Goal status: INITIAL  4.  FAAM to improve by at least 10 points  Baseline:  Goal status: INITIAL  5.  L ankle DF AROM to be 15* without increase in pain  Baseline:  Goal status: INITIAL     PLAN:  PT FREQUENCY:  1-2x/week  PT DURATION: 8 weeks  PLANNED INTERVENTIONS: 97750- Physical Performance Testing, 97110-Therapeutic exercises, 97530- Therapeutic activity, [id number]- Neuromuscular re-education, 97535- Self Care, 65784- Manual therapy, 97016- Vasopneumatic device, and 97033- Ionotophoresis 4mg /ml Dexamethasone  PLAN FOR NEXT SESSION: assess ain and use of ionto. Increase HEP and include back     [facility] Outpatient Rehabilitation at [facility] [address] [facility]. [place], [place], [address] Phone: [phone]   Fax:  [phone]  Patient Details  Name: Limuel Nieblas MRN: [id number] Date of Birth: 1990-09-17 Referring Provider:  Amedeo Jupiter, [place]*  Encounter Date: 10/16/2023   Aquilla Bayley, PTA 10/16/2023, 9:34 AM  [place] [facility] at [facility] [address]. [facility]. [place], [place], [address] Phone: [phone]   Fax:  [phone]

## 2023-10-18 ENCOUNTER — Ambulatory Visit: Admitting: Physical Therapy

## 2023-10-18 DIAGNOSIS — M25572 Pain in left ankle and joints of left foot: Secondary | ICD-10-CM | POA: Diagnosis not present

## 2023-10-18 DIAGNOSIS — M6281 Muscle weakness (generalized): Secondary | ICD-10-CM

## 2023-10-18 DIAGNOSIS — M5459 Other low back pain: Secondary | ICD-10-CM

## 2023-10-18 DIAGNOSIS — R262 Difficulty in walking, not elsewhere classified: Secondary | ICD-10-CM

## 2023-10-18 NOTE — Therapy (Signed)
 OUTPATIENT PHYSICAL THERAPY LOWER EXTREMITY TREATMENT   Patient Name: Roy Ferguson MRN: [id number] DOB:1991-02-03, 33 y.o., male Today's Date: 10/18/2023  END OF SESSION:  PT End of Session - 10/18/23 0933     Visit Number 4    Number of Visits 17    Date for PT Re-Evaluation 11/28/23    Authorization Type [employer]    Authorization Time Period 10/03/23 to 11/29/23    PT Start Time 0933    PT Stop Time 1015    PT Time Calculation (min) 42 min              Past Medical History:  Diagnosis Date   Anxiety    Seasonal allergies    Past Surgical History:  Procedure Laterality Date   NO PAST SURGERIES     Patient Active Problem List   Diagnosis Date Noted   Head injury 06/28/2017   Cough variant asthma  vs UACS 09/28/2016   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 No sure if patch helped or not. When asked about back" Not keeping up with that"  PERTINENT HISTORY: See above  PAIN:  Are you having pain? No pain,tightness  PRECAUTIONS: None  RED FLAGS: None   WEIGHT BEARING RESTRICTIONS: No  FALLS:  Has patient fallen in last 6 months? Yes. Number of falls multiple   LIVING ENVIRONMENT: Lives with: lives with their family Lives in: House/apartment Stairs: first floor apt Has following equipment at home: None  OCCUPATION: [employer]  PLOF: Independent, Independent with  basic ADLs, Independent with gait, and Independent with transfers  PATIENT GOALS: be able to jog for exercise without pain, strengthen back     OBJECTIVE:  Note: Objective measures were completed at Evaluation unless otherwise noted.  DIAGNOSTIC FINDINGS:   CLINICAL DATA:  Left ankle pain.   EXAM: LEFT ANKLE COMPLETE - 3+ VIEW   COMPARISON:  Left foot radiographs 02/03/2023   FINDINGS: The ankle mortise is symmetric and intact. Mild lateral malleolar soft tissue swelling. Joint spaces are preserved. No acute fracture is seen. No dislocation.   IMPRESSION: Mild lateral malleolar soft tissue swelling. No acute fracture.  PATIENT SURVEYS:  FAAM 35/80  COGNITION: Overall cognitive status: Within functional limits for tasks assessed     SENSATION: Top of foot going into toes       PALPATION: Very tender extensor tendons and talar joint line, joints in L foot and ankle, B malleoli VERY rigid and with very limited joint play   LOWER EXTREMITY MMT:  MMT Right eval Left eval  Hip flexion 4+ back pain  4+  Hip extension    Hip abduction    Hip adduction    Hip internal rotation    Hip external rotation    Knee flexion 4+ 4+  Knee extension 4+ 4+  Ankle dorsiflexion  4+  Ankle plantarflexion 4-  3+  Ankle inversion  4+  Ankle eversion  4   (Blank rows = not tested)  LOWER EXTREMITY ROM:  ROM Right eval Left eval Left 10/18/23  Hip flexion     Hip extension     Hip abduction     Hip adduction     Hip internal rotation     Hip external rotation     Knee flexion     Knee extension     Ankle dorsiflexion  2* 20  Ankle plantarflexion  60* top of foot pain  60  Ankle inversion  30* top of foot pain  45  Ankle eversion  18* 25   (Blank rows = not tested)  Lumbar flexion WNL RFIS some increased pain around R kidney area, lumbar extension mod limitation with radicular sx into tailbone and down LLE, lumbar lateral flexion mod limitation B increased tightness with  more reps   GAIT: Distance walked: in clinic distances  Assistive device utilized: None Level of assistance: Complete Independence Comments: pes planus, some inversion noted with toe off, noted L ankle valgus                                                                                                                                 TREATMENT DATE:   10/18/23  Bike L 4 TM OFF push/pull 20 x each LLE Goals assessed and ROM checked. Lumb WNLS no pain and Left ankle as above chart Black bar heel raise/toe raises 20x SLS on dyna disc 15 x 4 ways- increased pain with full wt bearing esp moving medially PROM with end range stretch left ankle, joint mobs to increase mobility and help pain 4 inch step down 15x with some pain 6 inch step up fwd and laterally 10 x Rocker board BOSU ROM and squats  10/16/23 Nustep L 4 Black bar heel raise/toe raises 15 x Leg Press 30# 2 sets 10,calf raises 2 sets 12 Feet on ball bridge,KTC and obl  SLR red tband 10x BIL then 10 x with SL into abd PROM and stretch to BIL LE and trunk Jt mobs to left ankle to increase ROM and decrease pain ( as he c/o ant pinching) Ionto Left ant ankle 1.1 cc 4 hour leave on patch Issued seated HS stretch for home       10/04/23 Bike L3 x31mins  Calf stretch 15s x2  Calf raises 2x10 Step ups 6"  STS 2x10 HS stretch with strap 15s x2 both sides  Feet on pball rotations and knees to chest x10 Bridges 2x10 Ankle TB green 20 reps each way  HEP  10/03/23  Eval, POC     PATIENT EDUCATION:  Education details: exam findings, POC  Person educated: Patient Education method: Medical illustrator Education comprehension: verbalized understanding, returned demonstration, and needs further education  HOME EXERCISE PROGRAM: Access Code: [email or web address] URL: [email or web address] Date: 10/04/2023 Prepared by: Donavon Fudge  Exercises -  Long Sitting Ankle Inversion with Resistance  - 1 x  daily - 7 x weekly - 2 sets - 10 reps - Long Sitting Ankle Eversion with Resistance  - 1 x daily - 7 x weekly - 2 sets - 10 reps - Long Sitting Ankle Plantar Flexion with Resistance  - 1 x daily - 7 x weekly - 2 sets - 10 reps - Long Sitting Ankle Dorsiflexion with Anchored Resistance  - 1 x daily - 7 x weekly - 2 sets - 10 reps - Gastroc Stretch on Wall  - 1 x daily - 7 x weekly - 2 sets - 2 reps - 15 hold  ASSESSMENT:  CLINICAL IMPRESSION:  pt states ankle is doing much better than at eval. ROM much improved . When asked about back he verb" not really keeping up with that" AROM WNLS without pain. STGS met. Focus session on ankle ROM and strengthening as back was not an issue. Some increased pain on the dynamic surfaces and with medial mvmt. Did HEP in clinic to make sure doing mvmt without compensations- cuing needed  Diagnosis  M25.572 (ICD-10-CM) - Pain in left ankle and joints of left foot  Objectives as above, he does have some back pain in multiple areas that may be affecting movement pattern and increasing ankle pain. Very pleasant but very talkative, which limited eval significantly today. Will make every effort to work towards appropriate goals.   OBJECTIVE IMPAIRMENTS: Abnormal gait, decreased activity tolerance, decreased coordination, decreased mobility, difficulty walking, decreased ROM, decreased strength, hypomobility, increased edema, and pain.   ACTIVITY LIMITATIONS: standing, squatting, sleeping, stairs, transfers, and locomotion level  PARTICIPATION LIMITATIONS: driving, shopping, community activity, and occupation  PERSONAL FACTORS: Age, Behavior pattern, Education, Fitness, Social background, and Time since onset of injury/illness/exacerbation are also affecting patient's functional outcome.   REHAB POTENTIAL: Fair chronicity of pain, multiple chronic orthopedic complaints that may or may not be related  CLINICAL DECISION MAKING: Evolving/moderate  complexity  EVALUATION COMPLEXITY: Moderate   GOALS: Goals reviewed with patient? No  SHORT TERM GOALS: Target date: 10/31/2023   Will be compliant with appropriate progressive HEP  Baseline: Goal status: 10/16/23 MET  2.  L ankle pain to be no more than 7/10 at worst  Baseline:  Goal status: 10/16/23 MET  3.  Lumbar AROM to be WNL without increased pain levels  Baseline:  Goal status: 10/18/23 MET  4.  Joint mobility in L ankle/foot to be no more than 50% limited  Baseline:  Goal status: 10/18/23 MET    LONG TERM GOALS: Target date: 11/28/2023    MMT to be 5/5 in all tested groups no pain  Baseline:  Goal status: INITIAL  2.  Will be able to ambulate unlimited distances with L ankle pain no more than 5/10 Baseline:  Goal status: INITIAL  3.  Will be compliant with appropriate gym based program that does not flare ankle pain and allows him to work towards fitness goals  Baseline:  Goal status: INITIAL  4.  FAAM to improve by at least 10 points  Baseline:  Goal status: INITIAL  5.  L ankle DF AROM to be 15* without increase in pain  Baseline:  Goal status: INITIAL     PLAN:  PT FREQUENCY: 1-2x/week  PT DURATION: 8 weeks  PLANNED INTERVENTIONS: 97750- Physical Performance Testing, 97110-Therapeutic exercises, 97530- Therapeutic activity, [id number]- Neuromuscular re-education, 97535- Self Care, [address]- Manual therapy, 97016- Vasopneumatic device, and 97033- Ionotophoresis 4mg /ml Dexamethasone  PLAN FOR NEXT  SESSION: assess and progress    [facility] Out[facility] Rehabilitation at [facility] [address]. [facility] [place], [place], [address] Phone: [phone]   Fax:  [phone]  Patient Details  Name: Deovion Batrez MRN: [id number] Date of Birth: 1991/03/22 Referring Provider:  Amedeo Jupiter, [place]*  Encounter Date: 10/18/2023   Aquilla Bayley, PTA 10/18/2023, 9:34 AM  [facility] at [facility] [address]. [facility]. [place], [place], [address] Phone: [phone]   Fax:  [phone] Outpatient Rehabilitation at [facility] [address] [facility] [place], [place], [address] Phone: [phone]   Fax:  [phone]  Patient Details  Name: Palmer Fahrner MRN: [id number] Date of Birth: [birth year] Referring Provider:  Amedeo Jupiter, [place]*  Encounter Date: 10/18/2023   Aquilla Bayley, PTA 10/18/2023, 9:34 AM  [place] Out[facility] Rehabilitation at [facility] [address]. [facility]. [place], [place], [address] Phone: [phone]   Fax:  [phone]

## 2023-10-23 ENCOUNTER — Ambulatory Visit: Admitting: Physical Therapy

## 2023-10-25 ENCOUNTER — Ambulatory Visit: Admitting: Physical Therapy

## 2023-10-25 DIAGNOSIS — M25572 Pain in left ankle and joints of left foot: Secondary | ICD-10-CM

## 2023-10-25 DIAGNOSIS — R262 Difficulty in walking, not elsewhere classified: Secondary | ICD-10-CM

## 2023-10-25 DIAGNOSIS — M6281 Muscle weakness (generalized): Secondary | ICD-10-CM

## 2023-10-25 NOTE — Therapy (Signed)
 OUTPATIENT PHYSICAL THERAPY LOWER EXTREMITY TREATMENT   Patient Name: Roy Ferguson MRN: [id number] DOB:Dec 08, 1990, 33 y.o., male Today's Date: 10/25/2023  END OF SESSION:  PT End of Session - 10/25/23 0940     Visit Number 5    Number of Visits 17    Date for PT Re-Evaluation 11/28/23    Authorization Type [employer]    Authorization Time Period 10/03/23 to 11/29/23    PT Start Time 0940    PT Stop Time 1015    PT Time Calculation (min) 35 min           Past Medical History:  Diagnosis Date   Anxiety    Seasonal allergies    Past Surgical History:  Procedure Laterality Date   NO PAST SURGERIES     Patient Active Problem List   Diagnosis Date Noted   Head injury 06/28/2017   Cough variant asthma  vs UACS 09/28/2016   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 Ankle feels a lot better, no pain- trying to  walk around and but more weight on it, no pain just weird, numbness 1st and 2nd toe. Back tight, random sharp pains.  PERTINENT HISTORY: See above  PAIN:  Are you having pain? No pain,tightness  PRECAUTIONS: None  RED FLAGS: None   WEIGHT BEARING RESTRICTIONS: No  FALLS:  Has patient fallen in last 6 months? Yes. Number of falls multiple   LIVING ENVIRONMENT: Lives with: lives with their family Lives in: House/apartment Stairs: first floor apt Has following equipment at home:  None  OCCUPATION: [employer]  PLOF: Independent, Independent with basic ADLs, Independent with gait, and Independent with transfers  PATIENT GOALS: be able to jog for exercise without pain, strengthen back     OBJECTIVE:  Note: Objective measures were completed at Evaluation unless otherwise noted.  DIAGNOSTIC FINDINGS:   CLINICAL DATA:  Left ankle pain.   EXAM: LEFT ANKLE COMPLETE - 3+ VIEW   COMPARISON:  Left foot radiographs 02/03/2023   FINDINGS: The ankle mortise is symmetric and intact. Mild lateral malleolar soft tissue swelling. Joint spaces are preserved. No acute fracture is seen. No dislocation.   IMPRESSION: Mild lateral malleolar soft tissue swelling. No acute fracture.  PATIENT SURVEYS:  FAAM 35/80  COGNITION: Overall cognitive status: Within functional limits for tasks assessed     SENSATION: Top of foot going into toes       PALPATION: Very tender extensor tendons and talar joint line, joints in L foot and ankle, B malleoli VERY rigid and with very limited joint play   LOWER EXTREMITY MMT:  MMT Right eval Left eval  Hip flexion 4+ back pain  4+  Hip extension    Hip abduction    Hip adduction    Hip internal rotation    Hip external rotation    Knee flexion 4+ 4+  Knee extension 4+ 4+  Ankle dorsiflexion  4+  Ankle plantarflexion 4- 3+  Ankle inversion  4+  Ankle eversion  4   (Blank rows = not tested)  LOWER EXTREMITY ROM:  ROM Right eval Left eval Left 10/18/23  Hip flexion     Hip extension     Hip abduction     Hip adduction     Hip internal rotation     Hip external rotation     Knee flexion     Knee extension     Ankle dorsiflexion  2* 20  Ankle plantarflexion  60* top of foot pain  60  Ankle inversion  30* top of foot pain  45  Ankle eversion  18* 25   (Blank rows = not tested)  Lumbar flexion WNL RFIS some increased pain around R kidney area, lumbar extension mod limitation with radicular sx into tailbone and down  LLE, lumbar lateral flexion mod limitation B increased tightness with more reps   GAIT: Distance walked: in clinic distances  Assistive device utilized: None Level of assistance: Complete Independence Comments: pes planus, some inversion noted with toe off, noted L ankle valgus                                                                                                                                 TREATMENT DATE:    10/25/23 SLS heel raise 15 x- pain but able to do, pain on achilles Step calf stretch 10 sec 3 x and raises 3 sets 10 Leg Press 40# 2 sets 10 BIL, Left SL 2 sets 10 20# 4 inch step down on Left 2 sets 12 BOSU step up left 15 x fwd and laterally 6# dead lift 2 sets 10 Black bar under heels, squat with 4# OH press 2 sets 10 Squats and lunges onto dynadisc    10/18/23  Bike L 4 TM OFF push/pull 20 x each LLE Goals assessed and ROM checked. Lumb WNLS no pain and Left ankle as above chart Black bar heel raise/toe raises 20x SLS on dyna disc 15 x 4 ways- increased pain with full wt bearing esp moving medially PROM with end range stretch left ankle, joint mobs to increase mobility and help pain 4 inch step down 15x with some pain 6 inch step up fwd and laterally 10 x Rocker board BOSU ROM and squats  10/16/23 Nustep L 4 Black bar heel raise/toe raises 15 x Leg Press 30# 2 sets 10,calf raises 2 sets 12 Feet on ball bridge,KTC and obl  SLR red tband 10x BIL then 10 x with SL into abd PROM and stretch to BIL LE and trunk Jt mobs to left ankle to increase ROM and decrease pain ( as he c/o ant pinching) Ionto Left ant ankle 1.1 cc 4 hour leave on patch Issued seated HS stretch for home       10/04/23 Bike L3 x52mins  Calf stretch 15s x2  Calf raises 2x10 Step ups 6  STS 2x10 HS stretch with strap 15s x2 both sides  Feet on pball rotations and knees to chest x10 Bridges 2x10 Ankle TB green 20 reps each way  HEP  10/03/23  Eval, POC      PATIENT EDUCATION:  Education details: exam findings, POC  Person educated: Patient Education method: Medical illustrator Education comprehension: verbalized understanding, returned demonstration, and needs further education  HOME EXERCISE PROGRAM: Access Code: [email or web address] URL: [email or web address] Date: 10/04/2023 Prepared by: Donavon Fudge  Exercises - Long Sitting Ankle Inversion with Resistance  - 1 x daily - 7 x weekly - 2 sets - 10 reps - Long Sitting Ankle Eversion with Resistance  - 1 x daily - 7 x weekly - 2 sets - 10 reps - Long Sitting Ankle Plantar Flexion with Resistance  - 1 x daily - 7 x weekly - 2 sets - 10 reps - Long Sitting Ankle Dorsiflexion with Anchored Resistance  - 1 x daily - 7 x weekly - 2 sets - 10 reps - Gastroc Stretch on Wall  - 1 x daily - 7 x weekly - 2 sets - 2 reps - 15 hold  ASSESSMENT:  CLINICAL IMPRESSION:  pt 10 min late after no showing last appt. Ankle feels a lot better per pt, no pain- trying to  walk around and put more weight on it, no pain just weird, numbness 1st and 2nd toe. Back tight, random sharp pains. Pt is unaware of any MD follow up? Progressed ex for ROM  and strength. Progressing with goals. Diagnosis  M25.572 (ICD-10-CM) - Pain in left ankle and joints of left foot  Objectives as above, he does have some back pain in multiple areas that may be affecting movement pattern and increasing ankle pain. Very pleasant but very talkative, which limited eval significantly today. Will make every effort to work towards appropriate goals.   OBJECTIVE IMPAIRMENTS: Abnormal gait, decreased activity tolerance, decreased coordination, decreased mobility, difficulty walking, decreased ROM, decreased strength, hypomobility, increased edema, and pain.   ACTIVITY LIMITATIONS: standing, squatting, sleeping, stairs, transfers, and locomotion level  PARTICIPATION LIMITATIONS: driving, shopping, community activity, and  occupation  PERSONAL FACTORS: Age, Behavior pattern, Education, Fitness, Social background, and Time since onset of injury/illness/exacerbation are also affecting patient's functional outcome.   REHAB POTENTIAL: Fair chronicity of pain, multiple chronic orthopedic complaints that may or may not be related  CLINICAL DECISION MAKING: Evolving/moderate complexity  EVALUATION COMPLEXITY: Moderate   GOALS: Goals reviewed with patient? No  SHORT TERM GOALS: Target date: 10/31/2023   Will be compliant with appropriate progressive HEP  Baseline: Goal status: 10/16/23 MET  2.  L ankle pain to be no more than 7/10 at worst  Baseline:  Goal status: 10/16/23 MET  3.  Lumbar AROM to be WNL without increased pain levels  Baseline:  Goal status: 10/18/23 MET  4.  Joint mobility in L ankle/foot to be no more than 50% limited  Baseline:  Goal status: 10/18/23 MET    LONG TERM GOALS: Target date: 11/28/2023    MMT to be 5/5 in all tested groups no pain  Baseline:  Goal status: 10/25/23 progressing  2.  Will be able to ambulate unlimited distances with L ankle pain no more than 5/10 Baseline:  Goal status: 10/25/23 progressing  3.  Will be compliant with appropriate gym based program that does not flare ankle pain and allows him to work towards fitness goals  Baseline:  Goal status:  INITIAL  4.  FAAM to improve by at least 10 points  Baseline:  Goal status: INITIAL  5.  L ankle DF AROM to be 15* without increase in pain  Baseline:  Goal status: 10/25/23 progressing     PLAN:  PT FREQUENCY: 1-2x/week  PT DURATION: 8 weeks  PLANNED INTERVENTIONS: 97750- Physical Performance Testing, 97110-Therapeutic exercises, 97530- Therapeutic activity, 97112- Neuromuscular re-education, 97535- Self Care, 16109- Manual therapy, 97016- Vasopneumatic device, and 97033- Ionotophoresis 4mg /ml Dexamethasone  PLAN FOR NEXT SESSION: assess and progress    [facility] at  [facility] [address] [facility] [place], [place], [address] Phone: [phone]   Fax:  [phone]  Patient Details  Name: Roy Ferguson MRN: [id number] Date of Birth: June 17, 1990 Referring Provider:  Amedeo Jupiter, [place]*  Encounter Date: 10/25/2023   Aquilla Bayley, PTA 10/25/2023, 9:40 AM  [facility] at [facility] [address] [facility] [place], [place], [address] Phone: [phone]   Fax:  [phone] at [facility] [address] [place], [place], [address] Phone: [phone]   Fax:  [phone]  Patient Details  Name: Roy Ferguson MRN: [id number] Date of Birth: 06-Jan-1991 Referring Provider:  Amedeo Jupiter, [place]*  Encounter Date: 10/25/2023   Aquilla Bayley, PTA 10/25/2023, 9:40 AM  [facility] at [facility] [address] [place], [place], [address] Phone: [phone]   Fax:  [phone]

## 2023-10-29 ENCOUNTER — Ambulatory Visit: Admitting: Physical Therapy

## 2023-10-30 ENCOUNTER — Ambulatory Visit: Payer: Self-pay | Admitting: Allergy & Immunology

## 2023-10-30 ENCOUNTER — Other Ambulatory Visit: Payer: Self-pay

## 2023-10-30 ENCOUNTER — Encounter: Payer: Self-pay | Admitting: Allergy & Immunology

## 2023-10-30 VITALS — BP 130/80 | HR 70 | Temp 97.7°F | Resp 18 | Ht 69.29 in | Wt 242.9 lb

## 2023-10-30 DIAGNOSIS — J4530 Mild persistent asthma, uncomplicated: Secondary | ICD-10-CM

## 2023-10-30 DIAGNOSIS — Z91018 Allergy to other foods: Secondary | ICD-10-CM

## 2023-10-30 DIAGNOSIS — T781XXD Other adverse food reactions, not elsewhere classified, subsequent encounter: Secondary | ICD-10-CM | POA: Diagnosis not present

## 2023-10-30 DIAGNOSIS — J310 Chronic rhinitis: Secondary | ICD-10-CM | POA: Diagnosis not present

## 2023-10-30 MED ORDER — MONTELUKAST SODIUM 10 MG PO TABS: 10.0000 mg | ORAL_TABLET | Freq: Every day | ORAL | 1 refills | Status: AC

## 2023-10-30 MED ORDER — ALBUTEROL SULFATE HFA 108 (90 BASE) MCG/ACT IN AERS: 2.0000 | INHALATION_SPRAY | Freq: Four times a day (QID) | RESPIRATORY_TRACT | 2 refills | Status: DC | PRN

## 2023-10-30 MED ORDER — BUDESONIDE-FORMOTEROL FUMARATE 80-4.5 MCG/ACT IN AERO: INHALATION_SPRAY | RESPIRATORY_TRACT | 5 refills | Status: DC

## 2023-10-30 NOTE — Addendum Note (Signed)
 Addended by: Mollie Anger on: 10/30/2023 12:11 PM   Modules accepted: Orders

## 2023-10-30 NOTE — Progress Notes (Signed)
 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 Chronic rhinitis  - Because of insurance stipulations, we cannot Ferguson skin testing on the same day as your first visit. - We are all working to fight this, but for now we need to Ferguson two separate visits.  - We will know more after we Ferguson testing at the next visit.  - The skin testing visit can be squeezed in at your convenience.  - Then we can make a more full plan to address all of your symptoms. - Be sure to stop your antihistamines for 3 days before this appointment.  - We are going to add on Singulair (montelukast) 10mg  daily. - This works with an antihistamines to help with allergy  symptoms.   2. Mild persistent asthma, uncomplicated - Lung testing looks fine today and - We are going a medication called Symbicort  which contains a long acting albuterol combined with an inhaled steroid. - This should help with exercise intolerance.  - You can also continue to use albuterol as needed for rescue.  - Spacer sample and demonstration provided. - Daily controller medication(s): Symbicort  80/4.42mcg two puffs once daily in the morning - Prior to physical activity: albuterol 2 puffs 10-15 minutes before physical activity. - Rescue medications: albuterol 4 puffs every 4-6 hours as needed - Changes during respiratory infections or worsening symptoms: Increase Symbicort  to 4 puffs twice daily for TWO WEEKS. 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  - The oral allergy  syndrome (OAS) or pollen-food allergy  syndrome (PFAS) is a relatively common form of food allergy , particularly in adults.  - It typically occurs in people who have pollen allergies when the immune system sees proteins on the food that look like proteins on the pollen.  - This results in the allergy  antibody (IgE) binding to the food instead of the pollen.  - Patients typically report itching and/or mild swelling of the mouth and throat immediately following ingestion of certain uncooked fruits (including nuts) or raw vegetables.  - Only a very small number of affected individuals experience systemic allergic reactions, such as anaphylaxis which occurs with true food allergies.   - We will Ferguson an EpiPen after the testing.   4. Return in about 1 week (around 11/06/2023) for ALLERGY  TESTING . You can have the follow up appointment with Roy Ferguson or a Nurse Practicioner (our Nurse Practitioners are excellent and always have Physician oversight!).    This note in its entirety was forwarded to the Provider who requested this consultation.  Subjective:   Roy Ferguson is a 33 y.o. 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  Discussed the use of AI scribe software for clinical note transcription with the patient and/or guardian, who gave verbal consent to proceed.  Roy Ferguson was referred by Roy Ferguson.     Roy Ferguson is  a 33 y.o. male presenting for an evaluation of asthma and allergies.   Asthma/Respiratory Symptom History: He has not used Flovent  in years. It is in his chart but it has been a while. He has a history of asthma, requiring a rescue inhaler, especially during physical activity. He has not used his rescue inhaler recently as it is out, and he has not used his maintenance inhaler, Flovent , for years. He recalls being on montelukast (Singulair) in the past but does not remember its effectiveness.  Allergic Rhinitis Symptom History: He experiences perennial allergic symptoms, with exacerbations in the spring. While outdoor symptoms improve in winter, indoor allergens such as pet dander from his dog persist. He has had a dog since childhood and recently lost one that was with him for nearly fifteen years. He has not undergone allergy  testing since his teenage years in [place], [place] , where he was found to be positive for multiple allergens. He has not been on immunotherapy but has used nasal sprays like fluticasone , which he found ineffective. He has been taking Zyrtec  regularly but feels it is no longer effective.  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 He also experiences oral allergy  syndrome with raw apples and carrots, causing lip swelling and itching, but can consume them cooked or in processed forms. He has not required hospital visits for these symptoms but has used an EpiPen in the past, which is now expired.  He works for [employer], which offers a flexible schedule, and has three children aged four years, one year, and four months. His partner, who was in management at [employer], is currently staying at home with the children. No eczema or other food allergies besides almonds. No recent hospital visits for his symptoms.   Otherwise, there is no history of other atopic diseases, including asthma, drug allergies, stinging insect  allergies, eczema, urticaria, or contact dermatitis. There is no significant infectious history.  Vaccinations are up to date.    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 If you have any questions, ask your nurse or doctor.          cetirizine  10 MG tablet Commonly known as: ZYRTEC  Take 1 tablet (10 mg total) by mouth daily.   famotidine  20 MG tablet Commonly known as: PEPCID  TAKE 1 TABLET(20 MG) BY MOUTH  TWICE DAILY   fluticasone  50 MCG/ACT nasal spray Commonly known as: FLONASE  Place 2 sprays into both nostrils 2 (two) times daily.   methylPREDNISolone  4 MG Tbpk tablet Commonly known as: MEDROL  DOSEPAK Take per packet instructions   pantoprazole  40 MG tablet Commonly known as: PROTONIX  Take 1 tablet (40 mg total) by mouth daily.        Birth History: non-contributory  Developmental History: non-contributory  Past Surgical History: Past Surgical History:  Procedure Laterality Date   NO PAST SURGERIES       Family History: Family History  Problem Relation Age of Onset   Allergic rhinitis Father    Allergic rhinitis Brother    Hypertrophic cardiomyopathy Brother    Cancer Neg Hx    Cancer - Cervical Neg Hx    Diabetes Neg Hx      Social History: Millie lives at home with his family including his partner and three children.  He lives in an apartment that is 29 to 32 years old.  There is carpeting throughout the apartment.  They have electric heating and central cooling.  There is 1  dog inside of the home.  There are dust mite covers on the bed and the pillows.  There is no tobacco exposure.  He currently works for [employer], but he does have an associates degree.  He was going to become an Technical sales engineer, but is now thinking about what he wants to Ferguson.  Originally, he was majoring in voice when he first went to college, but this was replaced by and declared and then he dropped out of school for period of time.  They Ferguson have a HEPA filter in the home.  There is exposure to fumes, chemicals, and dust.  They Ferguson not know lives near an interstate or industrial area.   Review of systems otherwise negative other than that mentioned in the HPI.    Objective:   Blood pressure 130/80, pulse 70, temperature 97.7 F (36.5 C), temperature source Temporal, resp. rate 18, height 5' 9.29 (1.76 m), weight 242 lb 14.4 oz (110.2 kg), SpO2 95%. Body mass index is 35.57 kg/m.     Physical Exam Vitals reviewed.  Constitutional:      Appearance: He is well-developed.     Comments: Friendly.  Talkative.  HENT:     Head: Normocephalic and atraumatic.     Right Ear: Tympanic membrane, ear canal and external ear normal. No drainage, swelling or tenderness. Tympanic membrane is not injected, scarred, erythematous, retracted or bulging.     Left Ear: Tympanic membrane, ear canal and external ear normal. No drainage, swelling or tenderness. Tympanic membrane is not injected, scarred, erythematous, retracted or bulging.     Nose: No nasal deformity, septal deviation, mucosal edema or rhinorrhea.     Right Turbinates: Enlarged, swollen and pale.     Left Turbinates: Enlarged, swollen and pale.     Right Sinus: No maxillary sinus tenderness or frontal sinus tenderness.     Left Sinus: No maxillary sinus tenderness or frontal sinus tenderness.     Mouth/Throat:     Lips: Pink.     Mouth: Mucous membranes are moist. Mucous membranes are not pale and not dry.     Pharynx: Uvula midline.      Comments: Mild cobblestoning.  Eyes:     General: Lids are normal. Allergic shiner present.        Right eye: No discharge.        Left eye:  No discharge.     Conjunctiva/sclera: Conjunctivae normal.     Right eye: Right conjunctiva is not injected. No chemosis.    Left eye: Left conjunctiva is not injected. No chemosis.    Pupils: Pupils are equal, round, and reactive to light.    Cardiovascular:     Rate and Rhythm: Normal rate and regular rhythm.     Heart sounds: Normal heart sounds.  Pulmonary:     Effort: Pulmonary effort is normal. No tachypnea, accessory muscle usage or respiratory distress.     Breath sounds: Normal breath sounds. No wheezing, rhonchi or rales.     Comments: Moving air well in all lung fields.  No increased work of breathing. Chest:     Chest wall: No tenderness.  Abdominal:     Tenderness: There is no abdominal tenderness. There is no guarding or rebound.  Lymphadenopathy:     Head:     Right side of head: No submandibular, tonsillar or occipital adenopathy.     Left side of head: No submandibular, tonsillar or occipital adenopathy.     Cervical: No cervical adenopathy.   Skin:    General: Skin is warm.     Capillary Refill: Capillary refill takes less than 2 seconds.     Coloration: Skin is not pale.     Findings: No abrasion, erythema, petechiae or rash. Rash is not papular, urticarial or vesicular.     Comments: No eczematous or urticarial lesions noted.   Neurological:     Mental Status: He is alert.   Psychiatric:        Behavior: Behavior is cooperative.      Diagnostic studies:    Spirometry: results normal (FEV1: 3.26/89%, FVC: 4.05/92%, FEV1/FVC: 80%).    Spirometry consistent with normal pattern. Albuterol four puffs via MDI treatment given in clinic with improvement in FEV1 and FVC, but it was not significant per ATS criteria.  He did feel clinically improved.  Allergy  Studies: deferred due to insurance stipulations that require  a separate visit for testing.     Drexel Gentles, MD [facility] 

## 2023-10-30 NOTE — Patient Instructions (Addendum)
 1. Chronic rhinitis  - Because of insurance stipulations, we cannot do skin testing on the same day as your first visit. - We are all working to fight this, but for now we need to do two separate visits.  - We will know more after we do testing at the next visit.  - The skin testing visit can be squeezed in at your convenience.  - Then we can make a more full plan to address all of your symptoms. - Be sure to stop your antihistamines for 3 days before this appointment.  - We are going to add on Singulair (montelukast) 10mg  daily. - This works with an antihistamines to help with allergy  symptoms.   2. Mild persistent asthma, uncomplicated - Lung testing looks fine today and - We are going a medication called Symbicort  which contains a long acting albuterol combined with an inhaled steroid. - This should help with exercise intolerance.  - You can also continue to use albuterol as needed for rescue.  - Spacer sample and demonstration provided. - Daily controller medication(s): Symbicort  80/4.59mcg two puffs once daily in the morning - Prior to physical activity: albuterol 2 puffs 10-15 minutes before physical activity. - Rescue medications: albuterol 4 puffs every 4-6 hours as needed - Changes during respiratory infections or worsening symptoms: Increase Symbicort  to 4 puffs twice daily for TWO WEEKS. 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  - The oral allergy  syndrome (OAS) or pollen-food allergy  syndrome (PFAS) is a relatively common form of food allergy , particularly in adults.  - It typically occurs in people  who have pollen allergies when the immune system sees proteins on the food that look like proteins on the pollen.  - This results in the allergy  antibody (IgE) binding to the food instead of the pollen.  - Patients typically report itching and/or mild swelling of the mouth and throat immediately following ingestion of certain uncooked fruits (including nuts) or raw vegetables.  - Only a very small number of affected individuals experience systemic allergic reactions, such as anaphylaxis which occurs with true food allergies.   - We will do an EpiPen after the testing.     4. Return in about 1 week (around 11/06/2023) for ALLERGY  TESTING . You can have the follow up appointment with Dr. Idolina Maker or a Nurse Practicioner (our Nurse Practitioners are excellent and always have Physician oversight!).    Please inform us  of any Emergency Department visits, hospitalizations, or changes in symptoms. Call us  before going to the ED for breathing or allergy  symptoms since we might be able to fit you in for a sick visit. Feel free to contact us  anytime with any questions, problems, or concerns.  It was a pleasure to meet you today!  Websites that have reliable patient information: 1. American Academy of Asthma, Allergy , and Immunology: www.aaaai.org 2. Food Allergy  Research and Education (FARE): foodallergy.org 3. Mothers of Asthmatics: http://www.asthmacommunitynetwork.org 4. [employer] of Allergy , Asthma, and Immunology: www.acaai.org      "Like" us  on Facebook and Instagram for our latest updates!      A healthy democracy  works best when [employer] participate! Make sure you are registered to vote! If you have moved or changed any of your contact information, you will need to get this updated before voting! Scan the QR codes below to learn more!     Allergy  Shots  Allergies are the result of a chain reaction that starts in the immune system. Your immune system controls how your body  defends itself. For instance, if you have an allergy  to pollen, your immune system identifies pollen as an invader or allergen. Your immune system overreacts by producing antibodies called Immunoglobulin E (IgE). These antibodies travel to cells that release chemicals, causing an allergic reaction.  The concept behind allergy  immunotherapy, whether it is received in the form of shots or tablets, is that the immune system can be desensitized to specific allergens that trigger allergy  symptoms. Although it requires time and patience, the payback can be long-term relief. Allergy  injections contain a dilute solution of those substances that you are allergic to based upon your skin testing and allergy  history.   How Do Allergy  Shots Work?  Allergy  shots work much like a vaccine. Your body responds to injected amounts of a particular allergen given in increasing doses, eventually developing a resistance and tolerance to it. Allergy  shots can lead to decreased, minimal or no allergy  symptoms.  There generally are two phases: build-up and maintenance. Build-up often ranges from three to six months and involves receiving injections with increasing amounts of the allergens. The shots are typically given once or twice a week, though more rapid build-up schedules are sometimes used.  The maintenance phase begins when the most effective dose is reached. This dose is different for each person, depending on how allergic you are and your response to the build-up injections. Once the maintenance dose is reached, there are longer periods between injections, typically two to four weeks.  Occasionally doctors give cortisone-type shots that can temporarily reduce allergy  symptoms. These types of shots are different and should not be confused with allergy  immunotherapy shots.  Who Can Be Treated with Allergy  Shots?  Allergy  shots may be a good treatment approach for people with allergic rhinitis (hay fever), allergic  asthma, conjunctivitis (eye allergy ) or stinging insect allergy .   Before deciding to begin allergy  shots, you should consider:   The length of allergy  season and the severity of your symptoms  Whether medications and/or changes to your environment can control your symptoms  Your desire to avoid long-term medication use  Time: allergy  immunotherapy requires a major time commitment  Cost: may vary depending on your insurance coverage  Allergy  shots for children age 60 and older are effective and often well tolerated. They might prevent the onset of new allergen sensitivities or the progression to asthma.  Allergy  shots are not started on patients who are pregnant but can be continued on patients who become pregnant while receiving them. In some patients with other medical conditions or who take certain common medications, allergy  shots may be of risk. It is important to mention other medications you talk to your allergist.   What are the two types of build-ups offered:   RUSH or Rapid Desensitization -- one day of injections lasting from 8:30-4:30pm, injections every 1 hour.  Approximately half of the build-up process is completed in that one day.  The following week, normal build-up is resumed, and this entails ~16 visits either weekly or twice weekly, until reaching your "maintenance dose" which is continued weekly until eventually getting spaced  out to every month for a duration of 3 to 5 years. The regular build-up appointments are nurse visits where the injections are administered, followed by required monitoring for 30 minutes.    Traditional build-up -- weekly visits for 6 -12 months until reaching "maintenance dose", then continue weekly until eventually spacing out to every 4 weeks as above. At these appointments, the injections are administered, followed by required monitoring for 30 minutes.     Either way is acceptable, and both are equally effective. With the rush protocol, the  advantage is that less time is spent here for injections overall AND you would also reach maintenance dosing faster (which is when the clinical benefit starts to become more apparent). Not everyone is a candidate for rapid desensitization.   IF we proceed with the RUSH protocol, there are premedications which must be taken the day before and the day after the rush only (this includes antihistamines, steroids, and Singulair).  After the rush day, no prednisone or Singulair is required, and we just recommend antihistamines taken on your injection day.  What Is An Estimate of the Costs?  If you are interested in starting allergy  injections, please check with your insurance company about your coverage for both allergy  vial sets and allergy  injections.  Please do so prior to making the appointment to start injections.  The following are CPT codes to give to your insurance company. These are the amounts we BILL to the insurance company, but the amount YOU WILL PAY and WE RECEIVE IS SUBSTANTIALLY LESS and depends on the contracts we have with different insurance companies.   Amount Billed to Insurance One allergy  vial set  CPT 95165   $ 1200     Two allergy  vial set  CPT 95165   $ 2400     Three allergy  vial set  CPT 95165   $ 3600     One injection   CPT 95115   $ 35  Two injections   CPT 95117   $ 40 RUSH (Rapid Desensitization) CPT 95180 x 8 hours $500/hour  Regarding the allergy  injections, your co-pay may or may not apply with each injection, so please confirm this with your insurance company. When you start allergy  injections, 1 or 2 sets of vials are made based on your allergies.  Not all patients can be on one set of vials. A set of vials lasts 6 months to a year depending on how quickly you can proceed with your build-up of your allergy  injections. Vials are personalized for each patient depending on their specific allergens.  How often are allergy  injection given during the build-up period?    Injections are given at least weekly during the build-up period until your maintenance dose is achieved. Per the doctor's discretion, you may have the option of getting allergy  injections two times per week during the build-up period. However, there must be at least 48 hours between injections. The build-up period is usually completed within 6-12 months depending on your ability to schedule injections and for adjustments for reactions. When maintenance dose is reached, your injection schedule is gradually changed to every two weeks and later to every three weeks. Injections will then continue every 4 weeks. Usually, injections are continued for a total of 3-5 years.   When Will I Feel Better?  Some may experience decreased allergy  symptoms during the build-up phase. For others, it may take as long as 12 months on the maintenance dose. If there is no improvement after  a year of maintenance, your allergist will discuss other treatment options with you.  If you aren't responding to allergy  shots, it may be because there is not enough dose of the allergen in your vaccine or there are missing allergens that were not identified during your allergy  testing. Other reasons could be that there are high levels of the allergen in your environment or major exposure to non-allergic triggers like tobacco smoke.  What Is the Length of Treatment?  Once the maintenance dose is reached, allergy  shots are generally continued for three to five years. The decision to stop should be discussed with your allergist at that time. Some people may experience a permanent reduction of allergy  symptoms. Others may relapse and a longer course of allergy  shots can be considered.  What Are the Possible Reactions?  The two types of adverse reactions that can occur with allergy  shots are local and systemic. Common local reactions include very mild redness and swelling at the injection site, which can happen immediately or several  hours after. Report a delayed reaction from your last injection. These include arm swelling or runny nose, watery eyes or cough that occurs within 12-24 hours after injection. A systemic reaction, which is less common, affects the entire body or a particular body system. They are usually mild and typically respond quickly to medications. Signs include increased allergy  symptoms such as sneezing, a stuffy nose or hives.   Rarely, a serious systemic reaction called anaphylaxis can develop. Symptoms include swelling in the throat, wheezing, a feeling of tightness in the chest, nausea or dizziness. Most serious systemic reactions develop within 30 minutes of allergy  shots. This is why it is strongly recommended you wait in your doctor's office for 30 minutes after your injections. Your allergist is trained to watch for reactions, and his or her staff is trained and equipped with the proper medications to identify and treat them.   Report to the nurse immediately if you experience any of the following symptoms: swelling, itching or redness of the skin, hives, watery eyes/nose, breathing difficulty, excessive sneezing, coughing, stomach pain, diarrhea, or light headedness. These symptoms may occur within 15-20 minutes after injection and may require medication.   Who Should Administer Allergy  Shots?  The preferred location for receiving shots is your prescribing allergist's office. Injections can sometimes be given at another facility where the physician and staff are trained to recognize and treat reactions, and have received instructions by your prescribing allergist.  What if I am late for an injection?   Injection dose will be adjusted depending upon how many days or weeks you are late for your injection.   What if I am sick?   Please report any illness to the nurse before receiving injections. She may adjust your dose or postpone injections depending on your symptoms. If you have fever, flu, sinus  infection or chest congestion it is best to postpone allergy  injections until you are better. Never get an allergy  injection if your asthma is causing you problems. If your symptoms persist, seek out medical care to get your health problem under control.  What If I am or Become Pregnant:  Women that become pregnant should schedule an appointment with [facility] before receiving any further allergy  injections.

## 2023-11-01 ENCOUNTER — Encounter: Payer: Self-pay | Admitting: Physical Therapy

## 2023-11-01 ENCOUNTER — Ambulatory Visit: Admitting: Physical Therapy

## 2023-11-01 DIAGNOSIS — M5459 Other low back pain: Secondary | ICD-10-CM

## 2023-11-01 DIAGNOSIS — M6281 Muscle weakness (generalized): Secondary | ICD-10-CM

## 2023-11-01 DIAGNOSIS — R29898 Other symptoms and signs involving the musculoskeletal system: Secondary | ICD-10-CM

## 2023-11-01 DIAGNOSIS — M25572 Pain in left ankle and joints of left foot: Secondary | ICD-10-CM

## 2023-11-01 DIAGNOSIS — R262 Difficulty in walking, not elsewhere classified: Secondary | ICD-10-CM

## 2023-11-01 NOTE — Therapy (Signed)
 OUTPATIENT PHYSICAL THERAPY LOWER EXTREMITY DISCHARGE   Patient Name: Roy Ferguson MRN: [id number] DOB:10-26-1990, 33 y.o., male Today's Date: 11/01/2023   PHYSICAL THERAPY DISCHARGE SUMMARY  Visits from Start of Care: 6  Current functional level related to goals / functional outcomes: See below    Remaining deficits: See below    Education / Equipment: See below    Patient agrees to discharge. Patient goals were partially met. Patient is being discharged due to meeting the stated rehab goals.   END OF SESSION:  PT End of Session - 11/01/23 1001     Visit Number 6    Number of Visits 17    Date for PT Re-Evaluation 11/28/23    Authorization Type [employer]    Authorization Time Period 10/03/23 to 11/29/23    PT Start Time 0942   late arrival   PT Stop Time 1011    PT Time Calculation (min) 29 min    Activity Tolerance Patient tolerated treatment well    Behavior During Therapy [facility] for tasks assessed/performed            Past Medical History:  Diagnosis Date   Anxiety    Asthma    Seasonal allergies    Past Surgical History:  Procedure Laterality Date   NO PAST SURGERIES     Patient Active Problem List   Diagnosis Date Noted   Head injury 06/28/2017   Cough variant asthma  vs UACS 09/28/2016   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   Anxiety 01/23/2012    Class: Chronic    PCP: Jobe Mulder DO   REFERRING PROVIDER: Amedeo Jupiter, PA-C  REFERRING DIAG:  Diagnosis  M25.572 (ICD-10-CM) - Pain in left ankle and joints of left foot    THERAPY DIAG:  Pain in left ankle and joints of left foot  Difficulty in walking, not elsewhere classified  Muscle weakness (generalized)  Other low back pain  Other symptoms and signs involving the musculoskeletal system  Rationale for Evaluation and Treatment: Rehabilitation  ONSET DATE: a couple of years ago   SUBJECTIVE:   SUBJECTIVE STATEMENT:    Everything has been happening at the same time, need a root canal but will have to pay out of pocket so kind of just hanging in there now. Working a lot now, having some discomfort not working all day but still having some to the point it feels like what it did before it got bad.   PERTINENT HISTORY: See above  PAIN:  Are you having pain? No 0/10 now   PRECAUTIONS: None  RED FLAGS: None   WEIGHT BEARING RESTRICTIONS: No  FALLS:  Has patient fallen in last 6 months? Yes. Number of falls multiple   LIVING ENVIRONMENT: Lives with: lives with their family Lives in: House/apartment Stairs: first floor apt Has following equipment at home: None  OCCUPATION: [employer]  PLOF: Independent, Independent with basic ADLs, Independent with gait, and Independent with transfers  PATIENT GOALS: be able to jog for exercise without pain, strengthen back     OBJECTIVE:  Note: Objective measures were completed at Evaluation unless otherwise noted.  DIAGNOSTIC FINDINGS:   CLINICAL DATA:  Left ankle pain.   EXAM: LEFT ANKLE COMPLETE - 3+ VIEW   COMPARISON:  Left foot radiographs 02/03/2023   FINDINGS: The ankle mortise is symmetric and intact. Mild lateral malleolar soft tissue swelling. Joint spaces are preserved. No acute fracture is seen. No dislocation.   IMPRESSION: Mild  lateral malleolar soft tissue swelling. No acute fracture.  PATIENT SURVEYS:  FAAM 35/80; 11/01/23 67/80  COGNITION: Overall cognitive status: Within functional limits for tasks assessed     SENSATION: Top of foot going into toes       PALPATION: Very tender extensor tendons and talar joint line, joints in L foot and ankle, B malleoli VERY rigid and with very limited joint play   LOWER EXTREMITY MMT:  MMT Right eval Left eval Right 11/01/23 Left 11/01/23  Hip flexion 4+ back pain  4+    Hip extension      Hip abduction      Hip adduction      Hip internal rotation      Hip external rotation       Knee flexion 4+ 4+    Knee extension 4+ 4+    Ankle dorsiflexion  4+ 5 5  Ankle plantarflexion 4- 3+ 3+ 3+  Ankle inversion  4+ 5 5  Ankle eversion  4 5 5    (Blank rows = not tested)  LOWER EXTREMITY ROM:  ROM Right eval Left eval Left 10/18/23 Left 11/01/23  Hip flexion      Hip extension      Hip abduction      Hip adduction      Hip internal rotation      Hip external rotation      Knee flexion      Knee extension      Ankle dorsiflexion  2* 20 20*  Ankle plantarflexion  60* top of foot pain  60 58*  Ankle inversion  30* top of foot pain  45   Ankle eversion  18* 25    (Blank rows = not tested)  Lumbar flexion WNL RFIS some increased pain around R kidney area, lumbar extension mod limitation with radicular sx into tailbone and down LLE, lumbar lateral flexion mod limitation B increased tightness with more reps   GAIT: Distance walked: in clinic distances  Assistive device utilized: None Level of assistance: Complete Independence Comments: pes planus, some inversion noted with toe off, noted L ankle valgus                                                                                                                                 TREATMENT DATE:   11/01/23  MMT, ROM, goal check/LTG update    Lots of education about progress with PT and POC moving forward, HEP progressions- return to referring and try [employer] for custom insoles/footwear. See clinical impression for details. DC today      10/25/23 SLS heel raise 15 x- pain but able to do, pain on achilles Step calf stretch 10 sec 3 x and raises 3 sets 10 Leg Press 40# 2 sets 10 BIL, Left SL 2 sets 10 20# 4 inch step down on Left 2 sets 12 BOSU step up left 15 x fwd and laterally 6# dead  lift 2 sets 10 Black bar under heels, squat with 4# OH press 2 sets 10 Squats and lunges onto dynadisc      PATIENT EDUCATION:  Education details: exam findings, POC  Person educated: Patient Education method:  Medical illustrator Education comprehension: verbalized understanding, returned demonstration, and needs further education  HOME EXERCISE PROGRAM: Access Code: [email or web address] URL: [email or web address] Date: 10/04/2023 Prepared by: Donavon Fudge  Exercises - Long Sitting Ankle Inversion with Resistance  - 1 x daily - 7 x weekly - 2 sets - 10 reps - Long Sitting Ankle Eversion with Resistance  - 1 x daily - 7 x weekly - 2 sets - 10 reps - Long Sitting Ankle Plantar Flexion with Resistance  - 1 x daily - 7 x weekly - 2 sets - 10 reps - Long Sitting Ankle Dorsiflexion with Anchored Resistance  - 1 x daily - 7 x weekly - 2 sets - 10 reps - Gastroc Stretch on Wall  - 1 x daily - 7 x weekly - 2 sets - 2 reps - 15 hold  ASSESSMENT:  CLINICAL IMPRESSION:    Arrived significantly late today. Updated measures for LTGs to determine ongoing POC. Seems like he is getting better, does continue to have a lot of concerns and complaints but its a bit unclear to me if all of these can realistically be addressed in PT. Existing HEP seems adequate to address his main remaining concerns of ankle weakness/feelings of buckling. He does have some complaints of catching in the back of his ankle, feel that referring could answer these questions a bit more accurately/potentially recommend additional imaging. Also recommended that he stop by [employer] for custom shoes/insoles. Has done very well with PT and made a lot of progress quickly, DC today     OBJECTIVE IMPAIRMENTS: Abnormal gait, decreased activity tolerance, decreased coordination, decreased mobility, difficulty walking, decreased ROM, decreased strength, hypomobility, increased edema, and pain.   ACTIVITY LIMITATIONS: standing, squatting, sleeping, stairs, transfers, and locomotion level  PARTICIPATION LIMITATIONS: driving, shopping, community activity, and occupation  PERSONAL FACTORS: Age, Behavior pattern, Education, Fitness, Social  background, and Time since onset of injury/illness/exacerbation are also affecting patient's functional outcome.   REHAB POTENTIAL: Fair chronicity of pain, multiple chronic orthopedic complaints that may or may not be related  CLINICAL DECISION MAKING: Evolving/moderate complexity  EVALUATION COMPLEXITY: Moderate   GOALS: Goals reviewed with patient? No  SHORT TERM GOALS: Target date: 10/31/2023   Will be compliant with appropriate progressive HEP  Baseline: Goal status: 10/16/23 MET  2.  L ankle pain to be no more than 7/10 at worst  Baseline:  Goal status: 10/16/23 MET  3.  Lumbar AROM to be WNL without increased pain levels  Baseline:  Goal status: 10/18/23 MET  4.  Joint mobility in L ankle/foot to be no more than 50% limited  Baseline:  Goal status: 10/18/23 MET    LONG TERM GOALS: Target date: 11/28/2023    MMT to be 5/5 in all tested groups no pain  Baseline:  Goal status: PARTIALLY MET 11/01/23  2.  Will be able to ambulate unlimited distances with L ankle pain no more than 5/10 Baseline:  Goal status: PARTIALLY MET 11/01/23- depends on the day   3.  Will be compliant with appropriate gym based program that does not flare ankle pain and allows him to work towards fitness goals  Baseline:  Goal status: NOT MET 11/01/23  4.  FAAM to improve by at least 10 points  Baseline:  Goal status: MET 11/01/23  5.  L ankle DF AROM to be 15* without increase in pain  Baseline:  Goal status: MET 11/01/23     PLAN:  PT FREQUENCY: 1-2x/week  PT DURATION: 8 weeks  PLANNED INTERVENTIONS: 97750- Physical Performance Testing, 97110-Therapeutic exercises, 97530- Therapeutic activity, 97112- Neuromuscular re-education, 97535- Self Care, 16109- Manual therapy, 97016- Vasopneumatic device, and 97033- Ionotophoresis 4mg /ml Dexamethasone  PLAN FOR NEXT SESSION: DC    Terrel Ferries, PT, DPT 11/01/23 10:13 AM

## 2023-11-13 ENCOUNTER — Ambulatory Visit: Admitting: Allergy & Immunology

## 2023-11-13 ENCOUNTER — Encounter: Payer: Self-pay | Admitting: Allergy & Immunology

## 2023-11-13 DIAGNOSIS — R221 Localized swelling, mass and lump, neck: Secondary | ICD-10-CM

## 2023-11-13 DIAGNOSIS — J302 Other seasonal allergic rhinitis: Secondary | ICD-10-CM | POA: Diagnosis not present

## 2023-11-13 DIAGNOSIS — T7805XD Anaphylactic reaction due to tree nuts and seeds, subsequent encounter: Secondary | ICD-10-CM | POA: Diagnosis not present

## 2023-11-13 DIAGNOSIS — J3089 Other allergic rhinitis: Secondary | ICD-10-CM

## 2023-11-13 DIAGNOSIS — R220 Localized swelling, mass and lump, head: Secondary | ICD-10-CM

## 2023-11-13 MED ORDER — AZELASTINE-FLUTICASONE 137-50 MCG/ACT NA SUSP: 2.0000 | NASAL | 5 refills | Status: DC

## 2023-11-13 MED ORDER — LEVOCETIRIZINE DIHYDROCHLORIDE 5 MG PO TABS: 5.0000 mg | ORAL_TABLET | Freq: Every evening | ORAL | 1 refills | Status: DC

## 2023-11-13 MED ORDER — EPINEPHRINE 0.3 MG/0.3ML IJ SOAJ: 0.3000 mg | Freq: Once | INTRAMUSCULAR | 2 refills | Status: AC

## 2023-11-13 NOTE — Patient Instructions (Signed)
 1. Chronic rhinitis  - Testing today showed: grasses, ragweed, weeds, trees, indoor molds, outdoor molds, dust mites, cat, dog, and cockroach - Copy of test results provided.  - Avoidance measures provided. - Stop taking: cetirizine  and fluticasone  - Continue with:  - Start taking: Xyzal (levocetirizine) 5mg  tablet once daily, Singulair  (montelukast ) 10mg  daily, and Dymista (fluticasone /azelastine) two sprays per nostril 1-2 times daily as needed - Singulair  can cause irritability and bad dreams, so beware of this rare side effect.  - You can use an extra dose of the antihistamine, if needed, for breakthrough symptoms.  - Consider nasal saline rinses 1-2 times daily to remove allergens from the nasal cavities as well as help with mucous clearance (this is especially helpful to do before the nasal sprays are given) - Strongly consider allergy  shots as a means of long-term control. - Allergy  shots re-train and reset the immune system to ignore environmental allergens and decrease the resulting immune response to those allergens (sneezing, itchy watery eyes, runny nose, nasal congestion, etc).    - Allergy  shots improve symptoms in 75-85% of patients.  - Make an appointment to start allergy  shots on your way out.   2. Mild persistent asthma, uncomplicated - Lung testing looks fine at the last visit. - We are not going to make any medication changes.  - You can also continue to use albuterol  as needed for rescue.  - Daily controller medication(s): Symbicort  80/4.50mcg two puffs once daily in the morning - Prior to physical activity: albuterol  2 puffs 10-15 minutes before physical activity. - Rescue medications: albuterol  4 puffs every 4-6 hours as needed - Changes during respiratory infections or worsening symptoms: Increase Symbicort  to 4 puffs twice daily for TWO WEEKS. 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 - Copy of testing results provided. - So I think that this is more than just oral allergy  syndrome. - Emergency Action Plan provided today. - EpiPen training provided today.   4. Return in about 3 months (around 02/13/2024). You can have the follow up appointment with Dr. Iva or a Nurse Practicioner (our Nurse Practitioners are excellent and always have Physician oversight!).    Please inform us  of any Emergency Department visits, hospitalizations, or changes in symptoms. Call us  before going to the ED for breathing or allergy  symptoms since we might be able to fit you in for a sick visit. Feel free to contact us  anytime with any questions, problems, or concerns.  It was a pleasure to meet you today!  Websites that have reliable patient information: 1. American Academy of Asthma, Allergy , and Immunology: www.aaaai.org 2. Food Allergy  Research and Education (FARE): foodallergy.org 3. Mothers of Asthmatics: http://www.asthmacommunitynetwork.org 4. [employer] of Allergy , Asthma, and Immunology: www.acaai.org      "Like" us  on Facebook and Instagram for our latest updates!      A healthy democracy works best when [employer] participate! Make sure you are registered to vote! If you have moved or changed any of your contact information, you will need to get this updated before voting! Scan the QR codes below to learn more!  Airborne Adult Perc - 11/13/23 1017     Time Antigen Placed 1017    Allergen Manufacturer Jestine    Location Back    Number of Test 55    1. Control-Buffer 50% Glycerol Negative    2. Control-Histamine 2+    3. [place]ia 3+    4. [place] 3+    5. Johnson Negative    6. [place]  Blue  3+    7. Meadow Fescue 3+    8. Perennial Rye 3+    9. Timothy 3+    10. Ragweed Mix Negative    11. Cocklebur Negative    12. Plantain,  English 2+    13. Baccharis Negative    14. Dog Fennel Negative    15. [place] Thistle 2+    16. Lamb's Quarters 2+    17. Sheep Sorrell 3+    18. Rough Pigweed 3+    19. Marsh Elder, Rough Negative    20. Mugwort, Common Negative    21. Box, Elder 3+    22. Cedar, red 3+    23. Sweet Gum 3+    24. Pecan Pollen 3+    25. Pine Mix Negative    26. Walnut, Black Pollen 3+    27. Red Mulberry 3+    28. Ash Mix 3+    29. Birch Mix 4+    30. Beech American 4+    31. Cottonwood, [place] 2+    32. Hickory, White 3+    33. Maple Mix 2+    34. Oak, [place] Mix 3+    35. Sycamore Eastern 3+    36. Alternaria Alternata Negative    37. Cladosporium Herbarum Negative    38. Aspergillus Mix Negative    39. Penicillium Mix Negative    40. Bipolaris Sorokiniana (Helminthosporium) Negative    41. Drechslera Spicifera (Curvularia) Negative    42. Mucor Plumbeus Negative    43. Fusarium Moniliforme Negative    44. Aureobasidium Pullulans (pullulara) Negative    45. Rhizopus Oryzae Negative    46. Botrytis Cinera Negative    47. Epicoccum Nigrum Negative    48. Phoma Betae Negative    49. Dust Mite Mix Negative    50. Cat Hair 10,000 BAU/ml 3+    51.  Dog Epithelia Negative    52. Mixed Feathers Negative    53. Horse Epithelia Negative    54. Cockroach, German Negative    55. Tobacco Leaf Negative          Intradermal - 11/13/23 1043     Time Antigen Placed 1045    Allergen Manufacturer Greer    Location Arm    Number of Test 10    Control Negative    Johnson 4+    Ragweed Mix 4+    Mold 1 3+    Mold 2 2+    Mold 3 Negative    Mold 4 2+    Mite Mix 4+    Dog 4+    Cockroach 3+          Food Adult Perc - 11/13/23 1000     Time Antigen Placed 1029    Allergen Manufacturer ALK Abello;Greer    Location Back    Number of  allergen test 3    12. Almond --   11 x 17   50. Carrots --   9 x 15   58. Apple Negative          Reducing Pollen Exposure  The American Academy of Allergy , Asthma and Immunology suggests the following steps to reduce your exposure to pollen during allergy  seasons.    Do not hang sheets or clothing out to dry; pollen may collect on these items. Do not mow lawns or spend time around freshly cut grass; mowing stirs up pollen. Keep windows closed at night.  Keep car windows closed while driving. Minimize morning activities outdoors, a time when pollen counts are usually at their highest. Stay indoors as much as possible when pollen counts or humidity is high and on windy days when pollen tends to remain in the air longer. Use air conditioning when possible.  Many air conditioners have filters that trap the pollen spores. Use a HEPA room air filter to remove pollen form the indoor air you breathe.  Control of Mold Allergen   Mold and fungi can grow on a variety of surfaces provided certain temperature and moisture conditions exist.  Outdoor molds grow on plants, decaying vegetation and soil.  The major outdoor mold, Alternaria and Cladosporium, are found in very high numbers during hot and dry conditions.  Generally, a late Summer - Fall peak is seen for common outdoor fungal spores.  Rain will temporarily lower outdoor mold spore count, but counts rise rapidly when the rainy period ends.  The most important indoor molds are Aspergillus and Penicillium.  Dark, humid and poorly ventilated basements are ideal sites for mold growth.  The next most common sites of mold growth are the bathroom and the kitchen.  Outdoor (Seasonal) Mold Control   Use air conditioning and keep windows closed Avoid exposure to decaying vegetation. Avoid leaf raking. Avoid grain handling. Consider wearing a face mask if working in moldy areas.    Indoor (Perennial) Mold Control    Maintain humidity below  50%. Clean washable surfaces with 5% bleach solution. Remove sources e.g. contaminated carpets.    Control of Dust Mite Allergen    Dust mites play a major role in allergic asthma and rhinitis.  They occur in environments with high humidity wherever human skin is found.  Dust mites absorb humidity from the atmosphere (ie, they do not drink) and feed on organic matter (including shed human and animal skin).  Dust mites are a microscopic type of insect that you cannot see with the naked eye.  High levels of dust mites have been detected from mattresses, pillows, carpets, upholstered furniture, bed covers, clothes, soft toys and any woven material.  The principal allergen of the dust mite is found in its feces.  A gram of dust may contain 1,000 mites and 250,000 fecal particles.  Mite antigen is easily measured in the air during house cleaning activities.  Dust mites do not bite and do not cause harm to humans, other than by triggering allergies/asthma.    Ways to decrease your exposure to dust mites in your home:  Encase mattresses, box springs and pillows with a mite-impermeable barrier or cover   Wash sheets, blankets and drapes weekly in hot water (130 F) with detergent and dry them in a dryer on the hot setting.  Have the room cleaned frequently with a vacuum cleaner and a damp dust-mop.  For carpeting or rugs, vacuuming with a vacuum cleaner equipped with a high-efficiency particulate air (HEPA) filter.  The dust mite allergic individual should not be in a room which is being cleaned and should wait 1 hour after cleaning before going into the room. Do not sleep on upholstered furniture (eg, couches).  If possible removing carpeting, upholstered furniture and drapery from the home is ideal.  Horizontal blinds should be eliminated in the rooms where the person spends the most time (bedroom, study, television room).  Washable vinyl, roller-type shades are optimal. Remove all non-washable stuffed  toys from the bedroom.  Wash stuffed toys weekly like sheets and blankets above.   Reduce indoor humidity to less than 50%.  Inexpensive humidity monitors can be purchased at most hardware stores.  Do not use a humidifier as can make the problem worse and are not recommended.  Control of Dog or Cat Allergen  Avoidance is the best way to manage a dog or cat allergy . If you have a dog or cat and are allergic to dog or cats, consider removing the dog or cat from the home. If you have a dog or cat but don't want to find it a new home, or if your family wants a pet even though someone in the household is allergic, here are some strategies that may help keep symptoms at bay:  Keep the pet out of your bedroom and restrict it to only a few rooms. Be advised that keeping the dog or cat in only one room will not limit the allergens to that room. Don't pet, hug or kiss the dog or cat; if you do, wash your hands with soap and water. High-efficiency particulate air (HEPA) cleaners run continuously in a bedroom or living room can reduce allergen levels over time. Regular use of a high-efficiency vacuum cleaner or a central vacuum can reduce allergen levels. Giving your dog or cat a bath at least once a week can reduce airborne allergen.  Control of Cockroach Allergen  Cockroach allergen has been identified as an important cause of acute attacks of asthma, especially in urban settings.  There are fifty-five species of cockroach that exist in the [place] , however only three, the [place], [place] and [place] species produce allergen that can affect patients with Asthma.  Allergens can be obtained from fecal particles, egg casings and secretions from cockroaches.    Remove food sources. Reduce access to water. Seal access and entry points. Spray runways with 0.5-1% Diazinon or Chlorpyrifos Blow boric acid power under stoves and refrigerator. Place bait stations (hydramethylnon) at feeding  sites.    Allergy  Shots  Allergies are the result of a chain reaction that starts in the immune system. Your immune system controls how your body defends itself. For instance, if you have an allergy  to pollen, your immune system identifies pollen as an invader or allergen. Your immune system overreacts by producing antibodies called Immunoglobulin E (IgE). These antibodies travel to cells that release chemicals, causing an allergic reaction.  The concept behind allergy  immunotherapy, whether it is received in the form of shots or tablets, is that the immune system can be desensitized to specific allergens that trigger allergy  symptoms. Although it requires time and patience, the payback can be long-term relief. Allergy  injections contain a dilute solution of those substances that you are allergic to based upon your skin testing and allergy  history.   How Do Allergy  Shots Work?  Allergy  shots work much like a vaccine. Your body responds to injected amounts of a particular allergen given in increasing doses, eventually developing a resistance and tolerance to it. Allergy  shots can lead to decreased, minimal or no allergy  symptoms.  There generally are two phases: build-up and maintenance. Build-up often ranges from three to six months and involves receiving injections with increasing amounts  of the allergens. The shots are typically given once or twice a week, though more rapid build-up schedules are sometimes used.  The maintenance phase begins when the most effective dose is reached. This dose is different for each person, depending on how allergic you are and your response to the build-up injections. Once the maintenance dose is reached, there are longer periods between injections, typically two to four weeks.  Occasionally doctors give cortisone-type shots that can temporarily reduce allergy  symptoms. These types of shots are different and should not be confused with allergy  immunotherapy  shots.  Who Can Be Treated with Allergy  Shots?  Allergy  shots may be a good treatment approach for people with allergic rhinitis (hay fever), allergic asthma, conjunctivitis (eye allergy ) or stinging insect allergy .   Before deciding to begin allergy  shots, you should consider:   The length of allergy  season and the severity of your symptoms  Whether medications and/or changes to your environment can control your symptoms  Your desire to avoid long-term medication use  Time: allergy  immunotherapy requires a major time commitment  Cost: may vary depending on your insurance coverage  Allergy  shots for children age 48 and older are effective and often well tolerated. They might prevent the onset of new allergen sensitivities or the progression to asthma.  Allergy  shots are not started on patients who are pregnant but can be continued on patients who become pregnant while receiving them. In some patients with other medical conditions or who take certain common medications, allergy  shots may be of risk. It is important to mention other medications you talk to your allergist.   What are the two types of build-ups offered:   RUSH or Rapid Desensitization -- one day of injections lasting from 8:30-4:30pm, injections every 1 hour.  Approximately half of the build-up process is completed in that one day.  The following week, normal build-up is resumed, and this entails ~16 visits either weekly or twice weekly, until reaching your "maintenance dose" which is continued weekly until eventually getting spaced out to every month for a duration of 3 to 5 years. The regular build-up appointments are nurse visits where the injections are administered, followed by required monitoring for 30 minutes.    Traditional build-up -- weekly visits for 6 -12 months until reaching "maintenance dose", then continue weekly until eventually spacing out to every 4 weeks as above. At these appointments, the injections are  administered, followed by required monitoring for 30 minutes.     Either way is acceptable, and both are equally effective. With the rush protocol, the advantage is that less time is spent here for injections overall AND you would also reach maintenance dosing faster (which is when the clinical benefit starts to become more apparent). Not everyone is a candidate for rapid desensitization.   IF we proceed with the RUSH protocol, there are premedications which must be taken the day before and the day after the rush only (this includes antihistamines, steroids, and Singulair ).  After the rush day, no prednisone or Singulair  is required, and we just recommend antihistamines taken on your injection day.  What Is An Estimate of the Costs?  If you are interested in starting allergy  injections, please check with your insurance company about your coverage for both allergy  vial sets and allergy  injections.  Please do so prior to making the appointment to start injections.  The following are CPT codes to give to your insurance company. These are the amounts we BILL to [employer],  but the amount YOU WILL PAY and WE RECEIVE IS SUBSTANTIALLY LESS and depends on the contracts we have with different insurance companies.   Amount Billed to Insurance One allergy  vial set  CPT 95165   $ 1200     Two allergy  vial set  CPT 95165   $ 2400     Three allergy  vial set  CPT 95165   $ 3600     One injection   CPT 95115   $ 35  Two injections   CPT 95117   $ 40 RUSH (Rapid Desensitization) CPT 95180 x 8 hours $500/hour  Regarding the allergy  injections, your co-pay may or may not apply with each injection, so please confirm this with your insurance company. When you start allergy  injections, 1 or 2 sets of vials are made based on your allergies.  Not all patients can be on one set of vials. A set of vials lasts 6 months to a year depending on how quickly you can proceed with your build-up of your allergy   injections. Vials are personalized for each patient depending on their specific allergens.  How often are allergy  injection given during the build-up period?   Injections are given at least weekly during the build-up period until your maintenance dose is achieved. Per the doctor's discretion, you may have the option of getting allergy  injections two times per week during the build-up period. However, there must be at least 48 hours between injections. The build-up period is usually completed within 6-12 months depending on your ability to schedule injections and for adjustments for reactions. When maintenance dose is reached, your injection schedule is gradually changed to every two weeks and later to every three weeks. Injections will then continue every 4 weeks. Usually, injections are continued for a total of 3-5 years.   When Will I Feel Better?  Some may experience decreased allergy  symptoms during the build-up phase. For others, it may take as long as 12 months on the maintenance dose. If there is no improvement after a year of maintenance, your allergist will discuss other treatment options with you.  If you aren't responding to allergy  shots, it may be because there is not enough dose of the allergen in your vaccine or there are missing allergens that were not identified during your allergy  testing. Other reasons could be that there are high levels of the allergen in your environment or major exposure to non-allergic triggers like tobacco smoke.  What Is the Length of Treatment?  Once the maintenance dose is reached, allergy  shots are generally continued for three to five years. The decision to stop should be discussed with your allergist at that time. Some people may experience a permanent reduction of allergy  symptoms. Others may relapse and a longer course of allergy  shots can be considered.  What Are the Possible Reactions?  The two types of adverse reactions that can occur with allergy   shots are local and systemic. Common local reactions include very mild redness and swelling at the injection site, which can happen immediately or several hours after. Report a delayed reaction from your last injection. These include arm swelling or runny nose, watery eyes or cough that occurs within 12-24 hours after injection. A systemic reaction, which is less common, affects the entire body or a particular body system. They are usually mild and typically respond quickly to medications. Signs include increased allergy  symptoms such as sneezing, a stuffy nose or hives.   Rarely, a serious systemic reaction called anaphylaxis  can develop. Symptoms include swelling in the throat, wheezing, a feeling of tightness in the chest, nausea or dizziness. Most serious systemic reactions develop within 30 minutes of allergy  shots. This is why it is strongly recommended you wait in your doctor's office for 30 minutes after your injections. Your allergist is trained to watch for reactions, and his or her staff is trained and equipped with the proper medications to identify and treat them.   Report to the nurse immediately if you experience any of the following symptoms: swelling, itching or redness of the skin, hives, watery eyes/nose, breathing difficulty, excessive sneezing, coughing, stomach pain, diarrhea, or light headedness. These symptoms may occur within 15-20 minutes after injection and may require medication.   Who Should Administer Allergy  Shots?  The preferred location for receiving shots is your prescribing allergist's office. Injections can sometimes be given at another facility where the physician and staff are trained to recognize and treat reactions, and have received instructions by your prescribing allergist.  What if I am late for an injection?   Injection dose will be adjusted depending upon how many days or weeks you are late for your injection.   What if I am sick?   Please report any  illness to the nurse before receiving injections. She may adjust your dose or postpone injections depending on your symptoms. If you have fever, flu, sinus infection or chest congestion it is best to postpone allergy  injections until you are better. Never get an allergy  injection if your asthma is causing you problems. If your symptoms persist, seek out medical care to get your health problem under control.  What If I am or Become Pregnant:  Women that become pregnant should schedule an appointment with [facility] before receiving any further allergy  injections.

## 2023-11-13 NOTE — Progress Notes (Signed)
 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 Chronic rhinitis  - Testing today showed: grasses, ragweed, weeds, trees, indoor molds, outdoor molds, dust mites, cat, dog, and cockroach - Copy of test results provided.  - Avoidance measures provided. - Stop taking: cetirizine  and fluticasone  - Continue with:  - Start taking: Xyzal (levocetirizine) 5mg  tablet once daily, Singulair  (montelukast ) 10mg  daily, and Dymista (fluticasone /azelastine) two sprays per nostril 1-2 times daily as needed - Singulair  can cause irritability and bad dreams, so beware of this rare side effect.  - You can use an extra dose of the antihistamine, if needed, for breakthrough symptoms.  - Consider nasal saline rinses 1-2 times daily to remove allergens from the nasal cavities as well as help with mucous clearance (this is especially helpful to do before the nasal sprays are given) - Strongly consider allergy  shots as a means of long-term control. - Allergy  shots re-train and reset the immune system to ignore environmental allergens and decrease the resulting immune response to those allergens (sneezing, itchy watery eyes, runny nose, nasal congestion, etc).    - Allergy  shots improve symptoms in 75-85% of patients.  - Make an appointment to start allergy  shots on your way out.   2. Mild persistent asthma, uncomplicated - Lung testing looks fine at the last visit. - We are not going to make any medication changes.  - You can also continue to use albuterol  as needed for rescue.  - Daily controller medication(s): Symbicort  80/4.38mcg two puffs once daily in the morning - Prior to  physical activity: albuterol  2 puffs 10-15 minutes before physical activity. - Rescue medications: albuterol  4 puffs every 4-6 hours as needed - Changes during respiratory infections or worsening symptoms: Increase Symbicort  to 4 puffs twice daily for TWO WEEKS. - Asthma control goals:  * Full participation in all desired activities (may need albuterol  before activity) * Albuterol  use two time or less a week on average (not counting use with activity) * Cough interfering with sleep two time or less a month * Oral steroids no more than once a year * No hospitalizations  3. Almond and carrot allergy  - Testing was POSITIVE to [facility]. - Copy of testing results provided. - So I think that this is more than just oral allergy  syndrome. - Emergency Action Plan provided today. - EpiPen training provided today.   4. Return in about 3 months (around 02/13/2024). You can have the follow up appointment with Dr. Iva or a Nurse Practicioner (our Nurse Practitioners are excellent and always have Physician oversight!).   Subjective:   Roy Ferguson is a 33 y.o. 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  Discussed the use of AI scribe software for  clinical note transcription with the patient and/or guardian, who gave verbal consent to proceed.  Roy Ferguson is a 33 y.o. male presenting for skin testing. He was last seen on June 17th. We could not do testing because his insurance company does not cover testing on the same day as a New Patient visit. He has been off of all  antihistamines 3 days in anticipation of the testing.   Otherwise, there have been no changes to his past medical history, surgical history, family history, or social history.    Review of systems otherwise negative other than that mentioned in the HPI.    Objective:   There were no vitals taken for this visit. There is no height or weight on file to calculate BMI.    Physical exam deferred since this was a skin testing appointment only.   Diagnostic studies:   Allergy  Studies:     Airborne Adult Perc - 11/13/23 1017     Time Antigen Placed 1017    Allergen Manufacturer Jestine    Location Back    Number of Test 55    1. Control-Buffer 50% Glycerol Negative    2. Control-Histamine 2+    3. Bahia 3+    4. [place] 3+    5. Johnson Negative    6. [place]  Blue 3+    7. Meadow Fescue 3+    8. Perennial Rye 3+    9. Timothy 3+    10. Ragweed Mix Negative    11. Cocklebur Negative    12. Plantain,  English 2+    13. Baccharis Negative    14. Dog Fennel Negative    15. Guernsey Thistle 2+    16. Lamb's Quarters 2+    17. Sheep Sorrell 3+    18. Rough Pigweed 3+    19. Marsh Elder, Rough Negative    20. Mugwort, Common Negative    21. Box, Elder 3+    22. Cedar, red 3+    23. Sweet Gum 3+    24. Pecan Pollen 3+    25. Pine Mix Negative    26. Walnut, Black Pollen 3+    27. Red Mulberry 3+    28. Ash Mix 3+    29. Birch Mix 4+    30. Beech American 4+    31. Cottonwood, [place] 2+    32. Hickory, White 3+    33. Maple Mix 2+    34. Oak, [place] Mix 3+    35. Sycamore Eastern 3+    36. Alternaria Alternata Negative    37. Cladosporium Herbarum Negative    38. Aspergillus Mix Negative    39. Penicillium Mix Negative    40. Bipolaris Sorokiniana (Helminthosporium) Negative    41. Drechslera Spicifera (Curvularia) Negative    42. Mucor Plumbeus Negative    43. Fusarium Moniliforme Negative    44. Aureobasidium Pullulans (pullulara) Negative    45. Rhizopus  Oryzae Negative    46. Botrytis Cinera Negative    47. Epicoccum Nigrum Negative    48. Phoma Betae Negative    49. Dust Mite Mix Negative    50. Cat Hair 10,000 BAU/ml 3+    51.  Dog Epithelia Negative    52. Mixed Feathers Negative    53. Horse Epithelia Negative    54. Cockroach, German Negative    55. Tobacco Leaf Negative          Intradermal - 11/13/23 1043     Time Antigen Placed 1045  Allergen Manufacturer Greer    Location Arm    Number of Test 10    Control Negative    Johnson 4+    Ragweed Mix 4+    Mold 1 3+    Mold 2 2+    Mold 3 Negative    Mold 4 2+    Mite Mix 4+    Dog 4+    Cockroach 3+          Food Adult Perc - 11/13/23 1000     Time Antigen Placed 1029    Allergen Manufacturer ALK Abello;Greer    Location Back    Number of allergen test 3    12. Almond --   11 x 17   50. Carrots --   9 x 15   58. Apple Negative          Allergy  testing results were read and interpreted by myself, documented by clinical staff.      Marty Shaggy, MD  [facility] 

## 2023-11-19 ENCOUNTER — Other Ambulatory Visit (HOSPITAL_COMMUNITY): Payer: Self-pay

## 2023-11-19 ENCOUNTER — Encounter: Payer: Self-pay | Admitting: Family Medicine

## 2023-11-19 ENCOUNTER — Ambulatory Visit (INDEPENDENT_AMBULATORY_CARE_PROVIDER_SITE_OTHER): Admitting: Family Medicine

## 2023-11-19 VITALS — BP 128/80 | HR 76 | Temp 98.0°F | Resp 16 | Ht 69.0 in | Wt 239.4 lb

## 2023-11-19 DIAGNOSIS — E782 Mixed hyperlipidemia: Secondary | ICD-10-CM

## 2023-11-19 NOTE — Patient Instructions (Signed)
Give us 2-3 business days to get the results of your labs back.   Keep the diet clean and stay active.  Aim to do some physical exertion for 150 minutes per week. This is typically divided into 5 days per week, 30 minutes per day. The activity should be enough to get your heart rate up. Anything is better than nothing if you have time constraints.  Let us know if you need anything.  

## 2023-11-19 NOTE — Progress Notes (Signed)
 Chief Complaint  Patient presents with   Follow-up    Follow Up    Subjective: Hyperlipidemia Patient presents for mixed hyperlipidemia follow up. Currently not on any medication.  He is sometimes adhering to a healthy diet. Exercise: walking No CP or SOB.  The patient is not known to have coexisting coronary artery disease.  Past Medical History:  Diagnosis Date   Anxiety    Asthma    Seasonal allergies     Objective: BP 128/80 (BP Location: Left Arm, Patient Position: Sitting)   Pulse 76   Temp 98 F (36.7 C) (Oral)   Resp 16   Ht 5' 9 (1.753 m)   Wt 239 lb 6.4 oz (108.6 kg)   SpO2 98%   BMI 35.35 kg/m  General: Awake, appears stated age HEENT: MMM Heart: RRR, no LE edema, no bruits Lungs: CTAB, no rales, wheezes or rhonchi. No accessory muscle use Psych: Age appropriate judgment and insight, normal affect and mood  Assessment and Plan: Mixed hyperlipidemia - Plan: Lipid panel  Could consider low-dose statin if not significantly improved.  He has been dealing with some musculoskeletal issues which has limited his exercise.  Counseled on diet and exercise.  Check labs. F/u in 6 months or as needed. The patient voiced understanding and agreement to the plan.  Mabel Mt Lyman, DO 11/19/23  4:54 PM

## 2023-11-20 ENCOUNTER — Ambulatory Visit: Payer: Self-pay | Admitting: Family Medicine

## 2023-11-20 LAB — LIPID PANEL
Cholesterol: 162 mg/dL (ref 0–200)
HDL: 30.4 mg/dL — ABNORMAL LOW (ref >39.00)
LDL Cholesterol: 86 mg/dL (ref 0–99)
NonHDL: 131.14
Total CHOL/HDL Ratio: 5
Triglycerides: 225 mg/dL — ABNORMAL HIGH (ref 0.0–149.0)
VLDL: 45 mg/dL — ABNORMAL HIGH (ref 0.0–40.0)

## 2023-11-21 ENCOUNTER — Other Ambulatory Visit: Payer: Self-pay

## 2023-11-21 DIAGNOSIS — E782 Mixed hyperlipidemia: Secondary | ICD-10-CM

## 2023-12-10 ENCOUNTER — Ambulatory Visit (INDEPENDENT_AMBULATORY_CARE_PROVIDER_SITE_OTHER): Admitting: Otolaryngology

## 2024-01-01 ENCOUNTER — Ambulatory Visit: Admitting: Family Medicine

## 2024-01-01 ENCOUNTER — Encounter: Payer: Self-pay | Admitting: Family Medicine

## 2024-01-01 VITALS — BP 132/80 | HR 73 | Temp 98.0°F | Resp 16 | Ht 69.0 in | Wt 226.0 lb

## 2024-01-01 DIAGNOSIS — R195 Other fecal abnormalities: Secondary | ICD-10-CM

## 2024-01-01 DIAGNOSIS — E782 Mixed hyperlipidemia: Secondary | ICD-10-CM

## 2024-01-01 NOTE — Progress Notes (Signed)
 Chief Complaint  Patient presents with   Follow-up    Follow Up    Subjective: Hyperlipidemia Patient presents for hyperlipidemia follow up. Currently not on any medication.  He is not adhering to a healthy diet. Exercise: walking No CP or SOB.  The patient is not known to have coexisting coronary artery disease.  Patient has a history of intermittent loose stools.  He has a dog and wonders if he has a parasite.  He is losing weight intentionally but was not having any issues prior to intentional efforts.  He does not have any nausea or vomiting.  No fevers.  No blood in his stool.  Past Medical History:  Diagnosis Date   Anxiety    Asthma    Seasonal allergies     Objective: BP 132/80 (BP Location: Left Arm, Patient Position: Sitting)   Pulse 73   Temp 98 F (36.7 C) (Oral)   Resp 16   Ht 5' 9 (1.753 m)   Wt 226 lb (102.5 kg)   SpO2 95%   BMI 33.37 kg/m  General: Awake, appears stated age HEENT: MMM Heart: RRR, no LE edema, no bruits Lungs: CTAB, no rales, wheezes or rhonchi. No accessory muscle use Psych: Age appropriate judgment and insight, normal affect and mood  Assessment and Plan: Mixed hyperlipidemia - Plan: Lipid panel  Loose stools - Plan: CBC w/Diff  Will have him come back for labs. Counseled on diet/exercise. He is doing a great job. F/u pending results.  Stay hydrated, consider Metamucil.  Unsure if he has a parasite but will check a CBC with differential first.  If not normal, will check a stool study. The patient voiced understanding and agreement to the plan.  Mabel Mt Climax, DO 01/01/24  3:53 PM

## 2024-01-01 NOTE — Patient Instructions (Addendum)
 Give us  2-3 business days to get the results of your labs back.   Keep the diet clean and stay active.  Very strong work losing weight.  Please consider adding some weight resistance exercise to your routine. Consider yoga as well.   Take Metamucil or Benefiber daily.  Let us  know if you need anything.

## 2024-01-02 ENCOUNTER — Other Ambulatory Visit

## 2024-01-02 ENCOUNTER — Ambulatory Visit: Payer: Self-pay | Admitting: Family Medicine

## 2024-01-02 ENCOUNTER — Other Ambulatory Visit (INDEPENDENT_AMBULATORY_CARE_PROVIDER_SITE_OTHER)

## 2024-01-02 DIAGNOSIS — E782 Mixed hyperlipidemia: Secondary | ICD-10-CM | POA: Diagnosis not present

## 2024-01-02 DIAGNOSIS — R195 Other fecal abnormalities: Secondary | ICD-10-CM

## 2024-01-02 LAB — LIPID PANEL
Cholesterol: 158 mg/dL (ref 0–200)
HDL: 35.1 mg/dL — ABNORMAL LOW (ref >39.00)
LDL Cholesterol: 91 mg/dL (ref 0–99)
NonHDL: 123.11
Total CHOL/HDL Ratio: 5
Triglycerides: 161 mg/dL — ABNORMAL HIGH (ref 0.0–149.0)
VLDL: 32.2 mg/dL (ref 0.0–40.0)

## 2024-01-02 LAB — CBC WITH DIFFERENTIAL/PLATELET
Basophils Absolute: 0 K/uL (ref 0.0–0.1)
Basophils Relative: 0.3 % (ref 0.0–3.0)
Eosinophils Absolute: 0.3 K/uL (ref 0.0–0.7)
Eosinophils Relative: 3.1 % (ref 0.0–5.0)
HCT: 41.6 % (ref 39.0–52.0)
Hemoglobin: 14.5 g/dL (ref 13.0–17.0)
Lymphocytes Relative: 35.8 % (ref 12.0–46.0)
Lymphs Abs: 3.5 K/uL (ref 0.7–4.0)
MCHC: 34.8 g/dL (ref 30.0–36.0)
MCV: 84.2 fl (ref 78.0–100.0)
Monocytes Absolute: 0.6 K/uL (ref 0.1–1.0)
Monocytes Relative: 5.7 % (ref 3.0–12.0)
Neutro Abs: 5.5 K/uL (ref 1.4–7.7)
Neutrophils Relative %: 55.1 % (ref 43.0–77.0)
Platelets: 251 K/uL (ref 150.0–400.0)
RBC: 4.94 Mil/uL (ref 4.22–5.81)
RDW: 14 % (ref 11.5–15.5)
WBC: 9.9 K/uL (ref 4.0–10.5)

## 2024-01-17 ENCOUNTER — Encounter (INDEPENDENT_AMBULATORY_CARE_PROVIDER_SITE_OTHER): Payer: Self-pay | Admitting: Otolaryngology

## 2024-01-17 ENCOUNTER — Ambulatory Visit (INDEPENDENT_AMBULATORY_CARE_PROVIDER_SITE_OTHER): Admitting: Otolaryngology

## 2024-01-17 VITALS — BP 125/82 | HR 89

## 2024-01-17 DIAGNOSIS — J3089 Other allergic rhinitis: Secondary | ICD-10-CM

## 2024-01-17 DIAGNOSIS — R0982 Postnasal drip: Secondary | ICD-10-CM

## 2024-01-17 DIAGNOSIS — J342 Deviated nasal septum: Secondary | ICD-10-CM

## 2024-01-17 DIAGNOSIS — R0981 Nasal congestion: Secondary | ICD-10-CM | POA: Diagnosis not present

## 2024-01-17 DIAGNOSIS — K219 Gastro-esophageal reflux disease without esophagitis: Secondary | ICD-10-CM

## 2024-01-17 DIAGNOSIS — R490 Dysphonia: Secondary | ICD-10-CM

## 2024-01-17 MED ORDER — LEVOCETIRIZINE DIHYDROCHLORIDE 5 MG PO TABS: 5.0000 mg | ORAL_TABLET | Freq: Every evening | ORAL | 1 refills | Status: DC

## 2024-01-17 MED ORDER — FLUTICASONE PROPIONATE 50 MCG/ACT NA SUSP: 2.0000 | Freq: Two times a day (BID) | NASAL | 6 refills | Status: DC

## 2024-01-17 NOTE — Patient Instructions (Signed)

## 2024-01-17 NOTE — Progress Notes (Signed)
 ENT Progress Note:   Update 01/17/2024  Discussed the use of AI scribe software for clinical note transcription with the patient, who gave verbal consent to proceed.  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   He notes improvement in his voice since the last visit, with no sharp pain and the ability to sing. However, he experiences soreness after rehearsing for 15 to 30 minutes, which he attributes to taking a break.   He has been using the prescribed allergy  pill and nasal sprays, but recently ran out of the nasal spray and requires a refill.  He underwent a recent root canal procedure, which caused significant pain for about a month and a half. During this period, he was taking the maximum dose of Tylenol  every four hours and ibuprofen  600 mg every four hours, leading to feeling overwhelmed by the medications. He received a crown placement the day before this visit.  Records Reviewed:  Initial Evaluation  Reason for Consult: chronic ear issues and hoarseness (sx for years)   HPI: Discussed the use of AI scribe software for clinical note transcription with the patient, who gave verbal consent to proceed.  History of Present Illness Roy Ferguson is a 33 year old male w/hx of environmental allergies, who presents with hoarseness and ear problems.  He has experienced hoarseness for the past two years, affecting his vocal control and increasing the effort required to sing. 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 He also experiences throat soreness, occasional hemoptysis, and expectoration of white chunks, suspected to be tonsil stones. During a recent illness, he described his cough as feeling like 'glass shards' in his throat.  He reports ear problems characterized by a sensation of fullness and popping, associated with brief dizzy spells. These symptoms have persisted for nearly ten  years, with episodes of dizziness lasting from seconds to minutes. No frequent ear infections or ear pain requiring antibiotics or ear tube placement. He recalls using ear drops as a child but does not remember any recurrent ear infections in his adult like.  He has a history of severe allergies and nasal congestion, for which he takes Zyrtec  daily. He has been tested for allergies and was found to be allergic to many things but has not received allergy  shots. He also has a history of severe heartburn and reflux, for which he recently started taking a supplement, which he finds helpful. He drinks two to three bottles of water daily and uses a liquid IV for hydration.  He has a history of smoking tobacco and marijuana but has quit both 3 mo ago. He sings three to four days a week and is focused on maintaining vocal hygiene.  No trouble swallowing or shortness of breath.  He has bruxism, and gets ear discomfort near TMJ joint frequently.  Records Reviewed:  Office visit 06/28/2017 with PCP Dr Claudene Costella Ferrier, DOB: [birth year], is a 33 y.o. male who presents for head injury sustained on 06/15/17. He tripped on a floor mat at a restaurant and hit the right side of his head. He did not lose consciousness. Patient did experiencing headache and fogginess. 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 Patient has not been back to school. Is starting a new job next week at [employer]. Patient states that his headaches have gone away and that the headache that  he is having is from his sinuses.   Concussion testing performed today: Patient has had some difficulty with reaction time the patient is stating that he has been smoking cannabis recently.    Past Medical History:  Diagnosis Date   Anxiety    Asthma    Seasonal allergies     Past Surgical History:  Procedure Laterality Date   NO PAST SURGERIES      Family History  Problem Relation Age of Onset   Allergic  rhinitis Father    Allergic rhinitis Brother    Hypertrophic cardiomyopathy Brother    Cancer Neg Hx    Cancer - Cervical Neg Hx    Diabetes Neg Hx     Social History:  reports that he quit smoking about 2 years ago. His smoking use included cigarettes. He started smoking about 4 years ago. He has a 3 pack-year smoking history. He has never used smokeless tobacco. He reports that he does not currently use alcohol. He reports that he does not currently use drugs after having used the following drugs: Marijuana.  Allergies:  Allergies  Allergen Reactions   Apple Juice     anaphylaxis   Cat Dander    Codeine    Dog Epithelium    Other     Almonds   Sulfa Drugs Cross Reactors     Medications: I have reviewed the patient's current medications.  The PMH, PSH, Medications, Allergies, and SH were reviewed and updated.  ROS: Constitutional: Negative for fever, weight loss and weight gain. Cardiovascular: Negative for chest pain and dyspnea on exertion. Respiratory: Is not experiencing shortness of breath at rest. Gastrointestinal: Negative for nausea and vomiting. Neurological: Negative for headaches. Psychiatric: The patient is not nervous/anxious  Blood pressure 125/82, pulse 89, SpO2 94%. There is no height or weight on file to calculate BMI.  PHYSICAL EXAM:  Exam: General: Well-developed, well-nourished Communication and Voice: overall clear  Respiratory Respiratory effort: Equal inspiration and expiration without stridor Cardiovascular Peripheral Vascular: Warm extremities with equal color/perfusion Eyes: No nystagmus with equal extraocular motion bilaterally Neuro/Psych/Balance: Patient oriented to person, place, and time; Appropriate mood and affect; Gait is intact with no imbalance; Cranial nerves I-XII are intact Head and Face Inspection: Normocephalic and atraumatic without mass or lesion Palpation: Facial skeleton intact without bony stepoffs Salivary Glands: No  mass or tenderness Facial Strength: Facial motility symmetric and full bilaterally ENT Pinna: External ear intact and fully developed External canal: Canal is patent with intact skin Tympanic Membrane: Clear and mobile External Nose: No scar or anatomic deformity Internal Nose: Septum is deviated to the left with narrowing of the nasal passages. No polyp, or purulence. Mucosal edema and erythema present.  Bilateral inferior turbinate hypertrophy.  Lips, Teeth, and gums: Mucosa and teeth intact and viable TMJ: (+) pain to palpation, mild subluxation left side with opening, full mobility Oral cavity/oropharynx: No erythema or exudate, no lesions present Nasopharynx: No mass or lesion with intact mucosa Hypopharynx: Intact mucosa without pooling of secretions Larynx Glottic: Full true vocal cord mobility without lesion or mass Supraglottic: Normal appearing epiglottis and AE folds Interarytenoid Space: Moderate pachydermia&edema Subglottic Space: Patent without lesion or edema Neck Neck and Trachea: Midline trachea without mass or lesion Thyroid: No mass or nodularity Lymphatics: No lymphadenopathy  Procedure:  Preoperative diagnosis: hoarseness  Postoperative diagnosis:   same post-nasal drip and GERD LPR  Procedure: Flexible fiberoptic laryngoscopy with stroboscopy (68420)   Surgeon: Elena Larry, MD  Anesthesia: Topical lidocaine  and  Afrin  Complications: None  Condition is stable throughout exam  Indications and consent:   The patient presents to the clinic with hoarseness. All the risks, benefits, and potential complications were reviewed with the patient preoperatively and informed verbal consent was obtained.  Procedure: The patient was seated upright in the exam chair.   Topical lidocaine  and Afrin were applied to the nasal cavity. After adequate anesthesia had occurred, the flexible telescope with strobe capabilities was passed into the nasal cavity. The nasopharynx  was patent without mass or lesion. The scope was passed behind the soft palate and directed toward the base of tongue. The base of tongue was visualized and was symmetric with no apparent masses or abnormal appearing tissue. There were no signs of a mass or pooling of secretions in the piriform sinuses. The supraglottic structures were normal.  The true vocal cords are mobile. The medial edges were straight. Closure was complete. Periodicity present. The mucosal wave and amplitude were intact and symmetric. There is moderate interarytenoid pachydermia and post cricoid edema. The mucosa appears without lesions.   The laryngoscope was then slowly withdrawn and the patient tolerated the procedure well. There were no complications or blood loss.  Studies Reviewed: CXR 04/01/21 FINDINGS: The heart size and mediastinal contours are within normal limits. Both lungs are clear. The visualized skeletal structures are unremarkable.   IMPRESSION: No active cardiopulmonary disease.  Assessment/Plan: Encounter Diagnoses  Name Primary?   Hoarseness Yes   Dysphonia [R49.0]    Chronic GERD    Nasal septal deviation    Environmental and seasonal allergies    Post-nasal drip     Assessment and Plan Assessment & Plan Eustachian tube dysfunction Chronic ear fullness and popping, likely due to nasal congestion and allergies. Normal ear exam, but tender at TMJ points with mild subluxation on the left, w/o trismus suggests TMJ sy could be causing ear discomfort. He also reports brief seconds of balance issues/dizziness when driving and when changing head position, lasting for seconds. Hx of concussion sy after head trauma, could be related to that.  - Continue Zyrtec  daily 10 mg, Rx sent. - Start Flonase  nasal spray twice daily. - Use saline nasal rinse regularly. - allergy  testing and potential immunotherapy/referral to Allergy  sent today.  Hoarseness/Dysphonia  Chronic hoarseness affecting singing,  range when singing. Hx of tonsil stones and GERD. Also has hx of smoking both tobacco and cannabis products, quit 3 months ago. Scope exam w/o masses or lesions, and normal VF mobility, but he did have evidence of GERD LPR and PND. We discussed that voice changes are likely multifactorial, likely due to chronic nasal congestion/PND reflux changes. - Prescribe medication for reflux - Pepcid  20 mg BID - Implement dietary changes to manage reflux. - Use seaweed-based supplement reflux gourmet after meals. - Continue Zyrtec  10 mg and Flonase  2 puffs b/l nares BID. - Maintain good hydration. - Avoid smoking tobacco, cigars, and marijuana. - Consider working with a Primary school teacher. - Research online resources for Higher education careers adviser. 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 - Consider dental guard. - Practice relaxation techniques.  Follow-up Plan to assess improvement in symptoms and treatment effectiveness. - Schedule follow-up appointment in a few months to evaluate progress.  Update 01/17/24 Dysphonia Improvement noted with mild soreness post-singing. Symmetric and normal vocal cord vibrations on strobe exam, with minima mucus and post-nasal drainage.  - Advise hydration before rehearsals. - Recommend pacing during rehearsals to avoid overuse.  Chronic nasal congestion with postnasal drip Persistent postnasal drip with mucus accumulation.  Emphasized adherence to allergy  medication regimen. - Refilled Xyzal  and Flonase , continue with both. - Continue allergy  pill and nasal spray regimen.  Gastroesophageal reflux disease Reflux control ongoing. Provided information on Reflux Gourmet as adjunctive treatment. - continue Pepcid  20 mg BID  -  Reflux Gourmet after meals - was not able to try - diet and lifestyle changes to minimize GERD - Refer to [employer] blog for dietary and lifestyle modifications/reflux cook book  Thank you for allowing me to participate in the care of this patient. Please do not hesitate to contact me with any questions or concerns.   Elena Larry, MD Otolaryngology [facility] Phone: [phone] Fax: [phone]    01/17/2024, 11:15 AM

## 2024-02-19 ENCOUNTER — Other Ambulatory Visit: Payer: Self-pay

## 2024-02-19 ENCOUNTER — Encounter: Payer: Self-pay | Admitting: Allergy & Immunology

## 2024-02-19 ENCOUNTER — Ambulatory Visit: Admitting: Allergy & Immunology

## 2024-02-19 VITALS — BP 120/88 | HR 71 | Temp 97.6°F | Resp 16 | Ht 70.0 in | Wt 217.4 lb

## 2024-02-19 DIAGNOSIS — J3089 Other allergic rhinitis: Secondary | ICD-10-CM | POA: Diagnosis not present

## 2024-02-19 DIAGNOSIS — R220 Localized swelling, mass and lump, head: Secondary | ICD-10-CM | POA: Diagnosis not present

## 2024-02-19 DIAGNOSIS — T7805XD Anaphylactic reaction due to tree nuts and seeds, subsequent encounter: Secondary | ICD-10-CM | POA: Diagnosis not present

## 2024-02-19 DIAGNOSIS — R221 Localized swelling, mass and lump, neck: Secondary | ICD-10-CM

## 2024-02-19 DIAGNOSIS — J302 Other seasonal allergic rhinitis: Secondary | ICD-10-CM

## 2024-02-19 DIAGNOSIS — J4540 Moderate persistent asthma, uncomplicated: Secondary | ICD-10-CM

## 2024-02-19 DIAGNOSIS — T7819XD Other adverse food reactions, not elsewhere classified, subsequent encounter: Secondary | ICD-10-CM

## 2024-02-19 MED ORDER — BUDESONIDE-FORMOTEROL FUMARATE 80-4.5 MCG/ACT IN AERO: 2.0000 | INHALATION_SPRAY | Freq: Two times a day (BID) | RESPIRATORY_TRACT | 5 refills | Status: AC

## 2024-02-19 MED ORDER — AZELASTINE-FLUTICASONE 137-50 MCG/ACT NA SUSP: 2.0000 | NASAL | 5 refills | Status: DC

## 2024-02-19 MED ORDER — LEVOCETIRIZINE DIHYDROCHLORIDE 5 MG PO TABS: 5.0000 mg | ORAL_TABLET | Freq: Every evening | ORAL | 1 refills | Status: AC

## 2024-02-19 NOTE — Progress Notes (Unsigned)
 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 Chronic rhinitis  - Testing at the last visit showed: grasses, ragweed, weeds, trees, indoor molds, outdoor molds, dust mites, cat, dog, and cockroach - Restart the Xyzal  to see if you still have the stomach pain (it might have been caused by ibuprofen ).  - Start taking: Xyzal  (levocetirizine) 5mg  tablet once daily, Singulair  (montelukast ) 10mg  daily, and Dymista  (fluticasone /azelastine ) two sprays per nostril 1-2 times daily as needed - Singulair  can cause irritability and bad dreams, so beware of this rare side effect.  - You can use an extra dose of the antihistamine, if needed, for breakthrough symptoms.  - Consider nasal saline rinses 1-2 times daily to remove allergens from the nasal cavities as well as help with mucous clearance (this is especially helpful to do before the nasal sprays are given) - Strongly consider allergy  shots as a means of long-term control. - Allergy  shots re-train and reset the immune system to ignore environmental allergens and decrease the resulting immune response to those allergens (sneezing, itchy watery eyes, runny nose, nasal congestion, etc).    - Allergy  shots improve symptoms in 75-85% of patients.  - Make an appointment to start allergy  shots on your way out.   2. Mild persistent asthma, uncomplicated - Lung testing not done today. - Daily controller medication(s): Symbicort  80/4.65mcg two puffs once daily in the morning - Prior to physical activity: albuterol  2 puffs 10-15 minutes before physical activity. - Rescue  medications: albuterol  4 puffs every 4-6 hours as needed - Changes during respiratory infections or worsening symptoms: Increase Symbicort  to 4 puffs twice daily for TWO WEEKS. - Asthma control goals:  * Full participation in all desired activities (may need albuterol  before activity) * Albuterol  use two time or less a week on average (not counting use with activity) * Cough interfering with sleep two time or less a month * Oral steroids no more than once a year * No hospitalizations  3. Almond and carrot allergy  - Testing was POSITIVE to [facility] in the past.  - We are gong to check on BANANA today. - EpiPen  is up to date. ';nm - Emergency Action Plan provided today.  4. Return in about 6 months (around 08/19/2024). You can have the follow up appointment with Dr. Iva or a Nurse Practicioner (our Nurse Practitioners are excellent and always have Physician oversight!).     Subjective:   Roy Ferguson is a 33 y.o. male presenting today for follow up of  Chief Complaint  Patient presents with  . Follow-up    He says he had allergic reaction with banana- throat swelling and reflux x 2 days ago. He needs allergy  shot paper because he lost it.  . Allergic Rhinitis     Roy Ferguson has a history of the following: Patient Active Problem List   Diagnosis Date Noted  . Head injury 06/28/2017  . Cough variant asthma  vs UACS 09/28/2016  . 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 Anxiety 01/23/2012    Class: Chronic    History obtained from: chart review and patient.  Discussed the use of AI scribe software for clinical note transcription with the patient and/or guardian, who gave verbal consent to proceed.  Roy Ferguson is a 33 y.o. male presenting for a follow up visit.  He was last seen in July 2025.  At that time, he had testing was positive to multiple indoor and outdoor allergens.  We stopped the cetirizine  and fluticasone  and started levo  cetirizine , uvular, and Dymista .  Lung testing looked stable.  We continue with Symbicort  80 mcg 2 puffs once daily in the morning.  He did have testing that was positive to almond and carrot.  EpiPen  was up-to-date and anaphylaxis management plan was updated.  Since last visit, he has done well.  Asthma/Respiratory Symptom History: He has asthma and uses albuterol . There was an issue with obtaining Symbicort  due to a high insurance copay. He is interested in using Symbicort  for long-term asthma management.  Allergic Rhinitis Symptom History: His seasonal allergies have improved with the change of season, but non-seasonal allergies remain severe. He is currently using a nasal spray and has not been taking Zyrtec  recently, noting increased congestion when he stops using the nasal spray. He is interested in allergy  shots and needs some information to pursue this and confirm the coverage from his insurance.   He experienced stomach pain while taking montelukast  along with high doses of ibuprofen  and Tylenol  for dental pain. Since stopping ibuprofen  and resolving dental issues, he is unsure if the stomach pain was due to medication interaction and seeks advice before continuing montelukast . No current stomach pain and he is not taking Pepcid  anymore.  Food Allergy  Symptom History: He has known allergies to almonds and carrots and has recently developed symptoms when consuming bananas. Initially, switching to organic bananas alleviated symptoms, but this is no longer effective.   Otherwise, there have been no changes to his past medical history, surgical history, family history, or social history.    Review of systems otherwise negative other than that mentioned in the HPI.    Objective:   Blood pressure 120/88, pulse 71, temperature 97.6 F (36.4 C), temperature source Temporal, resp. rate 16, height 5' 10 (1.778 m), weight 217 lb 6.4 oz (98.6 kg), SpO2 95%. Body mass index is 31.19  kg/m.    Physical Exam Vitals reviewed.  Constitutional:      Appearance: He is well-developed.     Comments: Friendly.  Talkative.  HENT:     Head: Normocephalic and atraumatic.     Right Ear: Tympanic membrane, ear canal and external ear normal. No drainage, swelling or tenderness. Tympanic membrane is not injected, scarred, erythematous, retracted or bulging.     Left Ear: Tympanic membrane, ear canal and external ear normal. No drainage, swelling or tenderness. Tympanic membrane is not injected, scarred, erythematous, retracted or bulging.     Nose: No nasal deformity, septal deviation, mucosal edema or rhinorrhea.     Right Turbinates: Enlarged, swollen and pale.     Left Turbinates: Enlarged, swollen and pale.     Right Sinus: No maxillary sinus tenderness or frontal sinus tenderness.     Left Sinus: No maxillary sinus tenderness or frontal sinus tenderness.     Mouth/Throat:     Lips: Pink.     Mouth: Mucous membranes are moist. Mucous membranes are not pale and not dry.     Pharynx: Uvula midline.     Comments: Mild cobblestoning. Eyes:     General: Lids are normal. Allergic shiner present.  Right eye: No discharge.        Left eye: No discharge.     Conjunctiva/sclera: Conjunctivae normal.     Right eye: Right conjunctiva is not injected. No chemosis.    Left eye: Left conjunctiva is not injected. No chemosis.    Pupils: Pupils are equal, round, and reactive to light.  Cardiovascular:     Rate and Rhythm: Normal rate and regular rhythm.     Heart sounds: Normal heart sounds.  Pulmonary:     Effort: Pulmonary effort is normal. No tachypnea, accessory muscle usage or respiratory distress.     Breath sounds: Normal breath sounds. No wheezing, rhonchi or rales.     Comments: Moving air well in all lung fields.  No increased work of breathing. Chest:     Chest wall: No tenderness.  Abdominal:     Tenderness: There is no abdominal tenderness. There is no guarding or  rebound.  Lymphadenopathy:     Head:     Right side of head: No submandibular, tonsillar or occipital adenopathy.     Left side of head: No submandibular, tonsillar or occipital adenopathy.     Cervical: No cervical adenopathy.  Skin:    General: Skin is warm.     Capillary Refill: Capillary refill takes less than 2 seconds.     Coloration: Skin is not pale.     Findings: No abrasion, erythema, petechiae or rash. Rash is not papular, urticarial or vesicular.     Comments: No eczematous or urticarial lesions noted.  Neurological:     Mental Status: He is alert.  Psychiatric:        Behavior: Behavior is cooperative.      Diagnostic studies:    Spirometry: results normal (FEV1: 3.64/99%, FVC: 4.40/100%, FEV1/FVC: 83%).    Spirometry consistent with normal pattern.    Allergy  Studies: none        Marty Shaggy, MD  [facility] 

## 2024-02-19 NOTE — Patient Instructions (Addendum)
 1. Chronic rhinitis  - Testing at the last visit showed: grasses, ragweed, weeds, trees, indoor molds, outdoor molds, dust mites, cat, dog, and cockroach - Restart the Xyzal  to see if you still have the stomach pain (it might have been caused by ibuprofen ).  - Start taking: Xyzal  (levocetirizine) 5mg  tablet once daily, Singulair  (montelukast ) 10mg  daily, and Dymista  (fluticasone /azelastine ) two sprays per nostril 1-2 times daily as needed - Singulair  can cause irritability and bad dreams, so beware of this rare side effect.  - You can use an extra dose of the antihistamine, if needed, for breakthrough symptoms.  - Consider nasal saline rinses 1-2 times daily to remove allergens from the nasal cavities as well as help with mucous clearance (this is especially helpful to do before the nasal sprays are given) - Strongly consider allergy  shots as a means of long-term control. - Allergy  shots re-train and reset the immune system to ignore environmental allergens and decrease the resulting immune response to those allergens (sneezing, itchy watery eyes, runny nose, nasal congestion, etc).    - Allergy  shots improve symptoms in 75-85% of patients.  - Make an appointment to start allergy  shots on your way out.   2. Mild persistent asthma, uncomplicated - Lung testing not done today. - Daily controller medication(s): Symbicort  80/4.30mcg two puffs once daily in the morning - Prior to physical activity: albuterol  2 puffs 10-15 minutes before physical activity. - Rescue medications: albuterol  4 puffs every 4-6 hours as needed - Changes during respiratory infections or worsening symptoms: Increase Symbicort  to 4 puffs twice daily for TWO WEEKS. - Asthma control goals:  * Full participation in all desired activities (may need albuterol  before activity) * Albuterol  use two time or less a week on average (not counting use with activity) * Cough interfering with sleep two time or less a month * Oral  steroids no more than once a year * No hospitalizations  3. Almond and carrot allergy  - Testing was POSITIVE to [facility] in the past.  - We are gong to check on BANANA today. - EpiPen  is up to date. ';nm - Emergency Action Plan provided today.  4. Return in about 6 months (around 08/19/2024). You can have the follow up appointment with Dr. Iva or a Nurse Practicioner (our Nurse Practitioners are excellent and always have Physician oversight!).    Please inform us  of any Emergency Department visits, hospitalizations, or changes in symptoms. Call us  before going to the ED for breathing or allergy  symptoms since we might be able to fit you in for a sick visit. Feel free to contact us  anytime with any questions, problems, or concerns.  It was a pleasure to see you again today!  Websites that have reliable patient information: 1. American Academy of Asthma, Allergy , and Immunology: www.aaaai.org 2. Food Allergy  Research and Education (FARE): foodallergy.org 3. Mothers of Asthmatics: http://www.asthmacommunitynetwork.org 4. [employer] of Allergy , Asthma, and Immunology: www.acaai.org      "Like" us  on Facebook and Instagram for our latest updates!      A healthy democracy works best when [employer] participate! Make sure you are registered to vote! If you have moved or changed any of your contact information, you will need to get this updated before voting! Scan the QR codes below to learn more!

## 2024-02-20 ENCOUNTER — Other Ambulatory Visit (INDEPENDENT_AMBULATORY_CARE_PROVIDER_SITE_OTHER): Payer: Self-pay | Admitting: Otolaryngology

## 2024-02-21 ENCOUNTER — Encounter: Payer: Self-pay | Admitting: Allergy & Immunology

## 2024-02-22 ENCOUNTER — Telehealth: Payer: Self-pay

## 2024-02-22 LAB — ALLERGEN BANANA: Allergen Banana IgE: 0.15 kU/L — AB

## 2024-02-22 MED ORDER — AZELASTINE-FLUTICASONE 137-50 MCG/ACT NA SUSP: 2.0000 | NASAL | 5 refills | Status: DC

## 2024-02-22 MED ORDER — AZELASTINE-FLUTICASONE 137-50 MCG/ACT NA SUSP: 2.0000 | NASAL | 5 refills | Status: AC

## 2024-02-22 NOTE — Addendum Note (Signed)
 Addended by: IVA MARTY SALTNESS on: 02/22/2024 12:23 PM   Modules accepted: Orders

## 2024-02-22 NOTE — Telephone Encounter (Signed)
 [place]ceived on base communication from [facility] [phone] fax [phone] patients azelastin/flutic 137-54mcg nsl spr not covered under patients plan. Please change to DYMISTA , OR RYALTRIST. Please advise.

## 2024-02-22 NOTE — Telephone Encounter (Signed)
 I thought I did this earlier today. I sent it in again.

## 2024-02-22 NOTE — Telephone Encounter (Signed)
 I sent in brand-name Dymista  today.

## 2024-02-22 NOTE — Telephone Encounter (Signed)
 Who's calling (name and relationship to patient) : fax from [facility] in [place]   Best contact number: [phone]   Provider they see:  Iva  Reason for call:  Drug change request / Drug not covered by patient plan. The preferred alternative is Dymista , Ryaltris

## 2024-02-22 NOTE — Addendum Note (Signed)
 Addended by: IVA MARTY SALTNESS on: 02/22/2024 04:20 PM   Modules accepted: Orders

## 2024-02-28 ENCOUNTER — Ambulatory Visit: Payer: Self-pay | Admitting: Allergy & Immunology

## 2024-03-17 ENCOUNTER — Encounter: Payer: Self-pay | Admitting: Radiology

## 2024-04-01 ENCOUNTER — Ambulatory Visit (INDEPENDENT_AMBULATORY_CARE_PROVIDER_SITE_OTHER): Admitting: Family Medicine

## 2024-04-01 ENCOUNTER — Encounter: Payer: Self-pay | Admitting: Family Medicine

## 2024-04-01 VITALS — BP 124/80 | HR 100 | Temp 98.0°F | Resp 16 | Ht 70.0 in | Wt 229.0 lb

## 2024-04-01 DIAGNOSIS — M79671 Pain in right foot: Secondary | ICD-10-CM | POA: Diagnosis not present

## 2024-04-01 DIAGNOSIS — Z0000 Encounter for general adult medical examination without abnormal findings: Secondary | ICD-10-CM | POA: Diagnosis not present

## 2024-04-01 DIAGNOSIS — J45991 Cough variant asthma: Secondary | ICD-10-CM | POA: Diagnosis not present

## 2024-04-01 DIAGNOSIS — N50812 Left testicular pain: Secondary | ICD-10-CM | POA: Diagnosis not present

## 2024-04-01 DIAGNOSIS — M542 Cervicalgia: Secondary | ICD-10-CM

## 2024-04-01 DIAGNOSIS — G8929 Other chronic pain: Secondary | ICD-10-CM | POA: Diagnosis not present

## 2024-04-01 DIAGNOSIS — Z23 Encounter for immunization: Secondary | ICD-10-CM

## 2024-04-01 DIAGNOSIS — M79672 Pain in left foot: Secondary | ICD-10-CM

## 2024-04-01 DIAGNOSIS — N50811 Right testicular pain: Secondary | ICD-10-CM

## 2024-04-01 MED ORDER — AIRSUPRA 90-80 MCG/ACT IN AERO: 2.0000 | INHALATION_SPRAY | Freq: Four times a day (QID) | RESPIRATORY_TRACT | 2 refills | Status: AC | PRN

## 2024-04-01 NOTE — Progress Notes (Signed)
 Chief Complaint  Patient presents with   Annual Exam    CPE    Well Male Roy Ferguson is here for a complete physical.   His last physical was >1 year ago.  Current diet: in general, a healthy diet.   Current exercise: none Weight trend: gaining Fatigue out of ordinary? No. Seat belt? Yes.   Advanced directive? No  Health maintenance Tetanus- Yes HIV- Yes Hep C- Yes  Pain in back, neck, radiating down legs and in bottom of R foot and L heel. 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 This has been going on for years. No skin changes or neuro s/s's.   Patient has a history of cough variant asthma.  He has been taking albuterol  as needed.  He will take it before physical exertion and sex.  A couple weeks ago he was having intercourse and had some burning and shortness of breath.  Albuterol  was not as helpful as it usually is.  He has not had any intercourse since then.  He was not doing anything out of the ordinary.  Patient has bilateral testicular pain that comes and goes.  He feels his testicles fluctuate in size.  Unknown if it correlates with ejaculation.  No current pain or trauma.  No urinary complaints.  He does have associated loose stools when he has bowel movements.  He stays hydrated.    Past Medical History:  Diagnosis Date   Anxiety    Asthma    Seasonal allergies      Past Surgical History:  Procedure Laterality Date   NO PAST SURGERIES      Medications  Current Outpatient Medications on File Prior to Visit  Medication Sig Dispense Refill   albuterol  (VENTOLIN  HFA) 108 (90 Base) MCG/ACT inhaler Inhale 2 puffs into the lungs every 6 (six) hours as needed for wheezing or shortness of breath. 8 g 2   Azelastine -Fluticasone  (DYMISTA ) 137-50 MCG/ACT SUSP Place 2 sprays into both nostrils 1 day or 1 dose. 23 g 5   budesonide -formoterol  (SYMBICORT ) 80-4.5 MCG/ACT inhaler Inhale 2 puffs into the lungs in the morning and at  bedtime. 1 each 5   cetirizine  (ZYRTEC ) 10 MG tablet Take 1 tablet (10 mg total) by mouth daily. 30 tablet 11   levocetirizine (XYZAL ) 5 MG tablet Take 1 tablet (5 mg total) by mouth every evening. 90 tablet 1   montelukast  (SINGULAIR ) 10 MG tablet Take 1 tablet (10 mg total) by mouth at bedtime. 90 tablet 1   Allergies Allergies  Allergen Reactions   Apple Juice     anaphylaxis   Cat Dander    Codeine    Dog Epithelium    Other     Almonds   Sulfa Drugs Cross Reactors     Family History Family History  Problem Relation Age of Onset   Allergic rhinitis Father    Allergic rhinitis Brother    Hypertrophic cardiomyopathy Brother    Cancer Neg Hx    Cancer - Cervical Neg Hx    Diabetes Neg Hx     Review of Systems: Constitutional: no fevers or chills Eye:  no recent significant change in vision Ear/Nose/Mouth/Throat:  Ears:  no hearing loss Nose/Mouth/Throat:  no complaints of nasal congestion, no sore throat Cardiovascular:  no current chest pain Respiratory:  +SOB w sex  Gastrointestinal:  no abdominal pain, +irreg BM's GU:  Male: negative for dysuria, +testicular pain Musculoskeletal/Extremities: as noted in  HPI Integumentary (Skin/Breast):  no abnormal skin lesions reported Neurologic:  no headaches Endocrine: No unexpected weight changes Hematologic/Lymphatic:  no night sweats  Exam BP 124/80 (BP Location: Left Arm, Patient Position: Sitting)   Pulse 100   Temp 98 F (36.7 C) (Oral)   Resp 16   Ht 5' 10 (1.778 m)   Wt 229 lb (103.9 kg)   SpO2 98%   BMI 32.86 kg/m  General:  well developed, well nourished, in no apparent distress Skin:  no significant moles, warts, or growths Head:  no masses, lesions, or tenderness Eyes:  pupils equal and round, sclera anicteric without injection Ears:  canals without lesions, TMs shiny without retraction, no obvious effusion, no erythema Nose:  nares patent, mucosa normal Throat/Pharynx:  lips and gingiva without  lesion; tongue and uvula midline; non-inflamed pharynx; no exudates or postnasal drainage Neck: neck supple without adenopathy, thyromegaly, or masses Lungs:  clear to auscultation, breath sounds equal bilaterally, no respiratory distress Cardio:  regular rate and rhythm, no bruits, no LE edema Abdomen:  abdomen soft, nontender; bowel sounds normal; no masses or organomegaly Genital (male): Circumcised, no external lesions, no hernia, no TTP Rectal: Deferred Musculoskeletal:  symmetrical muscle groups noted without atrophy or deformity Extremities:  no clubbing, cyanosis, or edema, no deformities, no skin discoloration Neuro:  gait normal; deep tendon reflexes normal and symmetric Psych: well oriented with normal range of affect and appropriate judgment/insight  Assessment and Plan  Well adult exam - Plan: CBC, Comprehensive metabolic panel with GFR, Lipid panel, Uric acid  Chronic pain of both feet - Plan: Ambulatory referral to Sports Medicine  Chronic neck pain - Plan: Ambulatory referral to Sports Medicine  Cough variant asthma - Plan: Albuterol -Budesonide  (AIRSUPRA ) 90-80 MCG/ACT AERO  Left testicular pain - Plan: US  Scrotum  Right testicular pain - Plan: US  Scrotum   Well 33 y.o. male. Counseled on diet and exercise. Self testicular exams recommended at least monthly.  Chronic foot/ankle pain: Refer to sports medicine.  He does need to stretch more but physical therapy was not particularly helpful. Testicular pain: Check scrotal ultrasound.  Doubt anything sinister. Fiber supplement recommended. PCV 20 and flu shot today. Asthma: Chronic, not ideally controlled.  Change albuterol  to Airsupra .  Rinse mouth out after use.  Hopefully this helps prevent exacerbations when he is physically active. Advanced directive form provided today.  Other orders as above. Follow up in 1 year pending the above workup. The patient voiced understanding and agreement to the plan.  Mabel [place], DO 04/01/24 4:55 PM

## 2024-04-01 NOTE — Patient Instructions (Addendum)
 Give us  2-3 business days to get the results of your labs back.   Keep the diet clean and stay active.  Do monthly self testicular checks in the shower. You are feeling for lumps/bumps that don't belong. If you feel anything like this, let me know!  Please get me a copy of your advanced directive form at your convenience.   Take Metamucil or Benefiber daily.  Ice/cold pack over area for 10-15 min twice daily.  Heat (pad or rice pillow in microwave) over affected area, 10-15 minutes twice daily.   OK to take Tylenol  1000 mg (2 extra strength tabs) or 975 mg (3 regular strength tabs) every 6 hours as needed.  Consider 100-200 mcg of Vit K2 for cramping.   Foods that may reduce pain: 1) Ginger 2) Blueberries 3) Salmon 4) Pumpkin seeds 5) Dark chocolate 6) Turmeric 7) Tart cherries 8) Virgin olive oil 9) Chili peppers 10) Mint 11) Krill oil  Let us  know if you need anything.

## 2024-04-02 ENCOUNTER — Other Ambulatory Visit: Payer: Self-pay

## 2024-04-02 ENCOUNTER — Ambulatory Visit: Payer: Self-pay | Admitting: Family Medicine

## 2024-04-02 DIAGNOSIS — E782 Mixed hyperlipidemia: Secondary | ICD-10-CM

## 2024-04-02 LAB — LIPID PANEL
Cholesterol: 180 mg/dL (ref 0–200)
HDL: 37.7 mg/dL — ABNORMAL LOW (ref >39.00)
LDL Cholesterol: 98 mg/dL (ref 0–99)
NonHDL: 141.96
Total CHOL/HDL Ratio: 5
Triglycerides: 218 mg/dL — ABNORMAL HIGH (ref 0.0–149.0)
VLDL: 43.6 mg/dL — ABNORMAL HIGH (ref 0.0–40.0)

## 2024-04-02 LAB — CBC
HCT: 42.6 % (ref 39.0–52.0)
Hemoglobin: 14.9 g/dL (ref 13.0–17.0)
MCHC: 34.9 g/dL (ref 30.0–36.0)
MCV: 82.8 fl (ref 78.0–100.0)
Platelets: 261 K/uL (ref 150.0–400.0)
RBC: 5.15 Mil/uL (ref 4.22–5.81)
RDW: 13.3 % (ref 11.5–15.5)
WBC: 11.6 K/uL — ABNORMAL HIGH (ref 4.0–10.5)

## 2024-04-02 LAB — COMPREHENSIVE METABOLIC PANEL WITH GFR
ALT: 24 U/L (ref 0–53)
AST: 17 U/L (ref 0–37)
Albumin: 4.7 g/dL (ref 3.5–5.2)
Alkaline Phosphatase: 52 U/L (ref 39–117)
BUN: 9 mg/dL (ref 6–23)
CO2: 26 meq/L (ref 19–32)
Calcium: 9.6 mg/dL (ref 8.4–10.5)
Chloride: 104 meq/L (ref 96–112)
Creatinine, Ser: 1 mg/dL (ref 0.40–1.50)
GFR: 99.67 mL/min (ref >60.00)
Glucose, Bld: 85 mg/dL (ref 70–99)
Potassium: 3.9 meq/L (ref 3.5–5.1)
Sodium: 140 meq/L (ref 135–145)
Total Bilirubin: 0.6 mg/dL (ref 0.2–1.2)
Total Protein: 7.6 g/dL (ref 6.0–8.3)

## 2024-04-02 LAB — URIC ACID: Uric Acid, Serum: 9.2 mg/dL — ABNORMAL HIGH (ref 4.0–7.8)

## 2024-04-02 MED ORDER — PREDNISONE 20 MG PO TABS: 40.0000 mg | ORAL_TABLET | Freq: Every day | ORAL | 0 refills | Status: AC

## 2024-04-02 NOTE — Addendum Note (Signed)
 Addended by: Beni Turrell M on: 04/02/2024 07:30 AM   Modules accepted: Orders

## 2024-04-06 ENCOUNTER — Ambulatory Visit (HOSPITAL_BASED_OUTPATIENT_CLINIC_OR_DEPARTMENT_OTHER)
Admission: RE | Admit: 2024-04-06 | Discharge: 2024-04-06 | Disposition: A | Discharge status: Home / Self Care | Source: Ambulatory Visit | Attending: Family Medicine | Admitting: Family Medicine

## 2024-04-06 DIAGNOSIS — N50811 Right testicular pain: Secondary | ICD-10-CM | POA: Diagnosis present

## 2024-04-06 DIAGNOSIS — N50812 Left testicular pain: Secondary | ICD-10-CM | POA: Insufficient documentation

## 2024-04-13 ENCOUNTER — Other Ambulatory Visit (INDEPENDENT_AMBULATORY_CARE_PROVIDER_SITE_OTHER): Payer: Self-pay | Admitting: Otolaryngology

## 2024-04-13 NOTE — Progress Notes (Unsigned)
   Subjective:    Patient ID: Roy Ferguson, male    DOB: 33 y.o., 1990/12/08   MRN: [id number]  Chief Complaint: Neck pain, bilateral foot pain.  Discussed the use of AI scribe software for clinical note transcription with the patient, who gave verbal consent to proceed.  History of Present Illness Roy Ferguson is a 33 year old male with past medical history significant for anxiety, compulsive behavior disorder, presenting for evaluation of ***.  Patient was evaluated by his astute primary care physician, Dr. Mabel Pry, at which time he mentioned pain in his back, neck, radiating down his legs, and in the bottom of his right foot and left heel without obvious injury or change in activity.  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  Has previously trialed physical therapy for this in some capacity.   Review of pertinent imaging: ***     Objective:   There were no vitals filed for this visit.  Cervical Spine -Inspection: no*** swelling or skin changes -Palpation: TTP *** paraspinals, *** midline -AROM/PROM: FROM in all planes of the neck -Strength: full neck flexion/extension (C1/C2), neck sidebending (C3), shoulder shrug (C4), shoulder abduction (C5), elbow flexion (C6), elbow extension (C7), thumb extension (C8/radial), finger abduction (T1/ulnar), OK sign (median) -Sensation: intact sensation of the thumb (C6), 2nd/3rd digits (C7), 4th/5th digits (C8). -Reflexes: normal biceps (C5/6), brachioradialis (C5/6), triceps (C7/8) reflexes, equal bilaterally -Special tests: *** tornado test   *** Ankle ( compared to normal ) Inspection: no swelling, normal longitudinal arches without significant collapse. Normal transverse arches without significant collapse. Palpation: TTP *** ATFL, *** CFL, *** PTFL, *** anterior joint line, *** navicular, *** base of 5th metatarsal, *** deltoid, ***  tarsal, *** metatarsal, *** achilles, *** plantar  fascia AROM/PROM: Full plantarflexion, dorsiflexion, eversion, inversion. FROM***  of the great toe Strength: 5/5 plantarflexion, 5/5 dorsiflexion, 5/5 eversion, 5/5 inversion Special tests: *** talar tilt test, *** anterior drawer, *** proximal squeeze, *** calcaneal squeeze, *** [place] ankle rules   *** Ankle ( compared to normal ) Inspection: no swelling, normal longitudinal arches without significant collapse. Normal transverse arches without significant collapse. Palpation: TTP *** ATFL, *** CFL, *** PTFL, *** anterior joint line, *** navicular, *** base of 5th metatarsal, *** deltoid, ***  tarsal, *** metatarsal, *** achilles, *** plantar fascia AROM/PROM: Full plantarflexion, dorsiflexion, eversion, inversion. FROM***  of the great toe Strength: 5/5 plantarflexion, 5/5 dorsiflexion, 5/5 eversion, 5/5 inversion Special tests: *** talar tilt test, *** anterior drawer, *** proximal squeeze, *** calcaneal squeeze, *** [place] ankle rules      Assessment & Plan:   Assessment & Plan

## 2024-04-14 ENCOUNTER — Ambulatory Visit

## 2024-04-14 ENCOUNTER — Ambulatory Visit (HOSPITAL_BASED_OUTPATIENT_CLINIC_OR_DEPARTMENT_OTHER): Admission: RE | Admit: 2024-04-14 | Discharge: 2024-04-14 | Disposition: A | Source: Ambulatory Visit

## 2024-04-14 VITALS — BP 136/85 | Ht 70.0 in | Wt 230.0 lb

## 2024-04-14 DIAGNOSIS — G8929 Other chronic pain: Secondary | ICD-10-CM | POA: Insufficient documentation

## 2024-04-14 DIAGNOSIS — M79671 Pain in right foot: Secondary | ICD-10-CM | POA: Diagnosis not present

## 2024-04-14 DIAGNOSIS — M5442 Lumbago with sciatica, left side: Secondary | ICD-10-CM | POA: Insufficient documentation

## 2024-04-14 DIAGNOSIS — M5441 Lumbago with sciatica, right side: Secondary | ICD-10-CM | POA: Diagnosis not present

## 2024-04-14 DIAGNOSIS — M542 Cervicalgia: Secondary | ICD-10-CM

## 2024-04-14 DIAGNOSIS — M549 Dorsalgia, unspecified: Secondary | ICD-10-CM | POA: Insufficient documentation

## 2024-04-14 DIAGNOSIS — M79672 Pain in left foot: Secondary | ICD-10-CM | POA: Diagnosis not present

## 2024-04-14 MED ORDER — NAPROXEN 500 MG PO TABS: 500.0000 mg | ORAL_TABLET | Freq: Two times a day (BID) | ORAL | 1 refills | Status: AC

## 2024-04-15 ENCOUNTER — Ambulatory Visit: Payer: Self-pay

## 2024-05-12 ENCOUNTER — Other Ambulatory Visit (INDEPENDENT_AMBULATORY_CARE_PROVIDER_SITE_OTHER)

## 2024-05-12 ENCOUNTER — Ambulatory Visit: Payer: Self-pay | Admitting: Family Medicine

## 2024-05-12 DIAGNOSIS — E782 Mixed hyperlipidemia: Secondary | ICD-10-CM | POA: Diagnosis not present

## 2024-05-12 LAB — LIPID PANEL
Cholesterol: 164 mg/dL (ref 28–200)
HDL: 37.1 mg/dL — ABNORMAL LOW
LDL Cholesterol: 83 mg/dL (ref 10–99)
NonHDL: 127.3
Total CHOL/HDL Ratio: 4
Triglycerides: 221 mg/dL — ABNORMAL HIGH (ref 10.0–149.0)
VLDL: 44.2 mg/dL — ABNORMAL HIGH (ref 0.0–40.0)

## 2024-05-13 ENCOUNTER — Ambulatory Visit: Admitting: Physical Therapy

## 2024-05-28 NOTE — Therapy (Incomplete)
 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  ONSET DATE: 04/14/24  SUBJECTIVE:                                                                                                                                                                                           SUBJECTIVE STATEMENT: ***  PERTINENT HISTORY:  Roy Ferguson is a 34 year old male with past medical history significant for anxiety, compulsive behavior disorder, presenting for evaluation of thoracic and lumbar back pain.   Patient was evaluated by his astute primary care physician, Dr. Mabel Pry, at which time he mentioned pain in his back, neck, radiating down his legs, and in the bottom of his right foot and left heel without obvious injury or change in activity.  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  Has previously trialed physical therapy for this in the spring.   Chronic low back pain and radiculopathy - Chronic low back pain since high school injury, initially caused by landing flat on his back during a somersault attempt over a brick wall, resulting in  several weeks of immobility - Pain is primarily localized to the lower back at the original impact site, with intermittent involvement of the mid-back - Pain radiates down both legs, associated with numbness, tingling, and a scraping sensation in the tailbone - Occasional abnormal sensations in the feet - Flares of pain occur intermittently, with episodes of tightening over months and a sensation of the back 'locking up' with significant pain - Hunching forward provokes stabbing pain in the tailbone; rotating or arching the back produces a catching sensation - Persistent tightness in the low back and hamstrings - No prior back imaging performed  Chronic neck and upper back pain -  Chronic neck pain with recent flare causing sharp pain with movement - Persistent upper back soreness associated with neck pain flare - Difficulty achieving comfort when lying down during neck pain episodes  PAIN:  Are you having pain? {OPRCPAIN:27236}  PRECAUTIONS: {Therapy precautions:24002}  RED FLAGS: {PT Red Flags:29287}   WEIGHT BEARING RESTRICTIONS: {Yes ***/No:24003}  FALLS:  Has patient fallen in last 6 months? {fallsyesno:27318}  LIVING ENVIRONMENT: Lives with: {OPRC lives with:25569::lives with their family} Lives in: {Lives in:25570} Stairs: {opstairs:27293} Has following equipment at home: {Assistive devices:23999}  OCCUPATION: ***  PLOF: {PLOF:24004}  PATIENT GOALS: ***  NEXT MD VISIT: ***  OBJECTIVE:  Note: Objective measures were completed at Evaluation unless otherwise noted.  DIAGNOSTIC FINDINGS:  FINDINGS: There is no evidence of thoracic spine fracture. Alignment is normal. No other significant bone abnormalities are identified.   IMPRESSION: Negative.  FINDINGS: There is no evidence of lumbar spine fracture. Alignment is normal. Intervertebral disc spaces are maintained.   IMPRESSION: Negative.    PATIENT SURVEYS:  {rehab surveys:24030}  COGNITION: Overall  cognitive status: {cognition:24006}     SENSATION: {sensation:27233}  MUSCLE LENGTH: Hamstrings: Right *** deg; Left *** deg Debby test: Right *** deg; Left *** deg  POSTURE: {posture:25561}  PALPATION: ***  LUMBAR ROM:   AROM eval  Flexion   Extension   Right lateral flexion   Left lateral flexion   Right rotation   Left rotation    (Blank rows = not tested)  LOWER EXTREMITY ROM:     {AROM/PROM:27142}  Right eval Left eval  Hip flexion    Hip extension    Hip abduction    Hip adduction    Hip internal rotation    Hip external rotation    Knee flexion    Knee extension    Ankle dorsiflexion    Ankle plantarflexion    Ankle inversion    Ankle eversion     (Blank rows = not tested)  LOWER EXTREMITY MMT:    MMT Right eval Left eval  Hip flexion    Hip extension    Hip abduction    Hip adduction    Hip internal rotation    Hip external rotation    Knee flexion    Knee extension    Ankle dorsiflexion    Ankle plantarflexion    Ankle inversion    Ankle eversion     (Blank rows = not tested)  LUMBAR SPECIAL TESTS:  {lumbar special test:25242}  FUNCTIONAL TESTS:  {Functional tests:24029}  GAIT: Distance walked: *** Assistive device utilized: {Assistive devices:23999} Level of assistance: {Levels of assistance:24026} Comments: ***  TREATMENT DATE: ***                                                                                                                                 PATIENT EDUCATION:  Education details: *** Person educated: {Person educated:25204} Education method: {Education Method:25205} Education comprehension: {Education Comprehension:25206}  HOME  EXERCISE PROGRAM: ***  ASSESSMENT:  CLINICAL IMPRESSION: Patient is a *** y.o. *** who was seen today for physical therapy evaluation and treatment for ***.  This issue seems exacerbated by physical activity and certain movements.  Differential includes lumbar  radiculopathy, piriformis syndrome.  Pain radiates down both legs with numbness and tingling. Previous physical therapy was ineffective though after reviewing his chart, it appears he was only focusing on his left ankle with this.  I will reorder physical therapy specifically targeting his back and we will plan for follow-up in 6 weeks.  Ordered x-rays of the low back and tailbone as well as thoracic spine. Prescribed anti-inflammatory medication for use during flare-ups.  He will follow-up in 6 weeks to assess if his bilateral foot pain is related to his back, though at the moment I think these are separate issues.     OBJECTIVE IMPAIRMENTS: {opptimpairments:25111}.   ACTIVITY LIMITATIONS: {activitylimitations:27494}  PARTICIPATION LIMITATIONS: {participationrestrictions:25113}  PERSONAL FACTORS: {Personal factors:25162} are also affecting patient's functional outcome.   REHAB POTENTIAL: {rehabpotential:25112}  CLINICAL DECISION MAKING: {clinical decision making:25114}  EVALUATION COMPLEXITY: {Evaluation complexity:25115}   GOALS: Goals reviewed with patient? {yes/no:20286}  SHORT TERM GOALS: Target date: ***  *** Baseline: Goal status: INITIAL  2.  *** Baseline:  Goal status: INITIAL  3.  *** Baseline:  Goal status: INITIAL  4.  *** Baseline:  Goal status: INITIAL  5.  *** Baseline:  Goal status: INITIAL  6.  *** Baseline:  Goal status: INITIAL  LONG TERM GOALS: Target date: ***  *** Baseline:  Goal status: INITIAL  2.  *** Baseline:  Goal status: INITIAL  3.  *** Baseline:  Goal status: INITIAL  4.  *** Baseline:  Goal status: INITIAL  5.  *** Baseline:  Goal status: INITIAL  6.  *** Baseline:  Goal status: INITIAL  PLAN:  PT FREQUENCY: {rehab frequency:25116}  PT DURATION: {rehab duration:25117}  PLANNED INTERVENTIONS: {rehab planned interventions:25118::97110-Therapeutic exercises,97530- Therapeutic [phone]-  Neuromuscular re-education,97535- Self Rjmz,02859- Manual therapy,Patient/Family education}.  PLAN FOR NEXT SESSION: ***   Almetta Fam, PT 05/28/2024, 1:24 PM  "

## 2024-05-29 ENCOUNTER — Ambulatory Visit

## 2024-08-21 ENCOUNTER — Ambulatory Visit: Admitting: Allergy & Immunology
